# Patient Record
Sex: Female | Born: 1965 | Race: White | Hispanic: No | Marital: Married | Smoking: Former smoker
Health system: Southern US, Community
[De-identification: ages and names within clinical notes are randomized; demographics above are authoritative.]

## PROBLEM LIST (undated history)

## (undated) DIAGNOSIS — N912 Amenorrhea, unspecified: Secondary | ICD-10-CM

## (undated) DIAGNOSIS — E119 Type 2 diabetes mellitus without complications: Secondary | ICD-10-CM

## (undated) DIAGNOSIS — I10 Essential (primary) hypertension: Secondary | ICD-10-CM

## (undated) DIAGNOSIS — F329 Major depressive disorder, single episode, unspecified: Secondary | ICD-10-CM

## (undated) DIAGNOSIS — IMO0002 Reserved for concepts with insufficient information to code with codable children: Secondary | ICD-10-CM

## (undated) DIAGNOSIS — F32A Depression, unspecified: Secondary | ICD-10-CM

## (undated) DIAGNOSIS — R32 Unspecified urinary incontinence: Secondary | ICD-10-CM

## (undated) DIAGNOSIS — F419 Anxiety disorder, unspecified: Secondary | ICD-10-CM

## (undated) HISTORY — PX: TUBAL LIGATION: SHX77

## (undated) HISTORY — DX: Reserved for concepts with insufficient information to code with codable children: IMO0002

## (undated) HISTORY — DX: Type 2 diabetes mellitus without complications: E11.9

## (undated) HISTORY — DX: Anxiety disorder, unspecified: F41.9

## (undated) HISTORY — DX: Essential (primary) hypertension: I10

## (undated) HISTORY — DX: Depression, unspecified: F32.A

## (undated) HISTORY — DX: Amenorrhea, unspecified: N91.2

## (undated) HISTORY — DX: Unspecified urinary incontinence: R32

## (undated) HISTORY — PX: CARPAL TUNNEL RELEASE: SHX101

## (undated) HISTORY — DX: Major depressive disorder, single episode, unspecified: F32.9

---

## 2001-09-04 ENCOUNTER — Other Ambulatory Visit: Admission: RE | Admit: 2001-09-04 | Discharge: 2001-09-04 | Payer: Self-pay | Admitting: Family Medicine

## 2001-09-15 ENCOUNTER — Encounter: Admission: RE | Admit: 2001-09-15 | Discharge: 2001-09-15 | Payer: Self-pay | Admitting: Family Medicine

## 2001-09-15 ENCOUNTER — Encounter: Payer: Self-pay | Admitting: Family Medicine

## 2002-08-31 ENCOUNTER — Other Ambulatory Visit: Admission: RE | Admit: 2002-08-31 | Discharge: 2002-08-31 | Payer: Self-pay | Admitting: Family Medicine

## 2004-01-26 ENCOUNTER — Other Ambulatory Visit: Admission: RE | Admit: 2004-01-26 | Discharge: 2004-01-26 | Payer: Self-pay | Admitting: Family Medicine

## 2004-09-04 ENCOUNTER — Encounter: Admission: RE | Admit: 2004-09-04 | Discharge: 2004-09-04 | Payer: Self-pay | Admitting: Obstetrics & Gynecology

## 2004-11-23 ENCOUNTER — Inpatient Hospital Stay (HOSPITAL_COMMUNITY): Admission: AD | Admit: 2004-11-23 | Discharge: 2004-11-23 | Payer: Self-pay | Admitting: Obstetrics & Gynecology

## 2004-11-25 ENCOUNTER — Inpatient Hospital Stay (HOSPITAL_COMMUNITY): Admission: AD | Admit: 2004-11-25 | Discharge: 2004-11-29 | Payer: Self-pay | Admitting: Obstetrics and Gynecology

## 2004-11-26 ENCOUNTER — Encounter (INDEPENDENT_AMBULATORY_CARE_PROVIDER_SITE_OTHER): Payer: Self-pay | Admitting: *Deleted

## 2005-01-15 ENCOUNTER — Ambulatory Visit (HOSPITAL_COMMUNITY): Admission: EM | Admit: 2005-01-15 | Discharge: 2005-01-15 | Payer: Self-pay | Admitting: Emergency Medicine

## 2005-01-15 ENCOUNTER — Encounter (INDEPENDENT_AMBULATORY_CARE_PROVIDER_SITE_OTHER): Payer: Self-pay | Admitting: *Deleted

## 2005-08-26 ENCOUNTER — Encounter: Admission: RE | Admit: 2005-08-26 | Discharge: 2005-08-26 | Payer: Self-pay | Admitting: Family Medicine

## 2006-05-02 ENCOUNTER — Ambulatory Visit: Payer: Self-pay | Admitting: Family Medicine

## 2007-01-26 ENCOUNTER — Encounter: Admission: RE | Admit: 2007-01-26 | Discharge: 2007-01-26 | Payer: Self-pay | Admitting: Family Medicine

## 2008-02-10 ENCOUNTER — Encounter: Admission: RE | Admit: 2008-02-10 | Discharge: 2008-02-10 | Payer: Self-pay | Admitting: Family Medicine

## 2008-05-17 ENCOUNTER — Encounter: Admission: RE | Admit: 2008-05-17 | Discharge: 2008-08-01 | Payer: Self-pay | Admitting: Family Medicine

## 2008-05-27 ENCOUNTER — Other Ambulatory Visit: Admission: RE | Admit: 2008-05-27 | Discharge: 2008-05-27 | Payer: Self-pay | Admitting: Family Medicine

## 2009-03-06 ENCOUNTER — Encounter: Admission: RE | Admit: 2009-03-06 | Discharge: 2009-03-06 | Payer: Self-pay | Admitting: Family Medicine

## 2010-06-02 ENCOUNTER — Encounter: Payer: Self-pay | Admitting: Family Medicine

## 2010-06-03 ENCOUNTER — Encounter: Payer: Self-pay | Admitting: Family Medicine

## 2010-09-28 NOTE — Op Note (Signed)
NAME:  Gabrielle Russell, Gabrielle Russell                ACCOUNT NO.:  [id number]   MEDICAL RECORD NO.:  [id number]          PATIENT TYPE:  EMS   LOCATION:  ED                           FACILITY:  [facility]   PHYSICIAN:  Lorre Munroe., M.D.DATE OF BIRTH:  Dec 08, 1965   DATE OF PROCEDURE:  01/15/2005  DATE OF DISCHARGE:                                 OPERATIVE REPORT   PREOPERATIVE DIAGNOSIS:  Right breast abscess.   POSTOPERATIVE DIAGNOSIS:  Right breast abscess.   OPERATION:  Incision and drainage of right breast abscess.   SURGEON:  Lebron Conners, M.D.   ANESTHESIA:  General.   PROCEDURE:  After the patient was monitored and anesthetized and had routine  preparation and draping of the right breast, I made a lateral circumareolar  incision over the induration and redness.  I quickly entered a very large  abscess cavity occupying large part of the lateral aspect of the breast.  I  suctioned it out and took cultures.  I widely opened the incision and then  probed and used a hemostat make sure I had broken up all the ramifications  of the abscess.  Palpating in other areas some milk could be squeezed out of  portions of the breast, but I did not feel there was any other abscess.  I  then removed a portion of abscess wall and sent that as a biopsy.  The  hemostasis was easily achieved with the Bovie.  I packed the cavity with  moist gauze and applied a bulky compressive bandage.  The patient tolerated  the operation well.      Lorre Munroe., M.D.  Electronically Signed     WB/MEDQ  D:  01/15/2005  T:  01/16/2005  Job:  [id number]   cc:   Genia Del, M.D.  [address]  [place]  [place] [address]  Fax: [phone]

## 2010-09-28 NOTE — Op Note (Signed)
NAME:  Gabrielle Russell, Gabrielle Russell                ACCOUNT NO.:  [id number]   MEDICAL RECORD NO.:  [id number]          PATIENT TYPE:  INP   LOCATION:  9101                          [facility]ACILITY:  [facility]   PHYSICIAN:  Genia Del, M.D.DATE OF BIRTH:  24-Jul-1965   DATE OF PROCEDURE:  11/26/2004  DATE OF DISCHARGE:                                 OPERATIVE REPORT   PREOPERATIVE DIAGNOSIS:  39+ weeks gestation, gestational diabetes mellitus  A2, oligohydramnios, mild pregnancy-induced hypertension, failure to  progress, desire for voluntary tubal sterilization.   POSTOPERATIVE DIAGNOSIS:  39+ weeks gestation, gestational diabetes mellitus  A2, oligohydramnios, mild pregnancy-induced hypertension, failure to  progress, desire for voluntary tubal sterilization.   INTERVENTION:  Urgent primary low transverse C-section, plus bilateral tubal  sterilization by modified Pomeroy procedure.   SURGEON:  Genia Del, M.D.   ANESTHESIOLOGIST:  Germaine Pomfret, M.D.   PROCEDURE:  Under epidural anesthesia, the patient was in 15-degree left  decubitus position.  She was prepped with Hibiclens on the abdominal  suprapubic, and vulvar areas.  The bladder catheter was already in place,  and the patient was draped as usual.  Verification of anesthesia, which was  adequate.  Infiltration of Marcaine 0.25% plain 20 cc subcutaneously.  A  Pfannenstiel incision with a scalpel.  The aponeurosis was opened  transversely with Mayo scissors.  The recti muscles were separated from the  aponeurosis superiorly and inferiorly.  The parietal peritoneum was opened  longitudinally with Metzenbaum scissors.  We then put the bladder in place.  The visceral peritoneum was opened transversely with Metzenbaum scissors.  The bladder was reclined downward and the bladder retractor was  repositioned.  We made a low transverse hysterotomy with a scalpel and  extended on each side with dressing scissors.  The amniotic fluid was  clear.  The fetus was in cephalic presentation.  Birth of a baby boy at 3:00 a.m.  The baby was suctioned after delivery of the head.  A loose cord was  present.  We clamped the cord and cut.  The baby was given to the neonatal  team.  Apgars were 9 and 9.  The placenta was extracted manually.  Revision  of the intrauterine cavity.  Pitocin IV was started.  A dose of Ancef 1 g IV  was given.  The cord blood had been taken.  We then closed the hysterotomy  with a first running locked suture of Vicryl 0.  A second plain was done in  a mattress fashion was done with Vicryl 0.  Hemostasis was adequate at all  levels at the hysterotomy.  Both ovaries were normal to inspection.  Both  tubes were normal to inspection.  We proceeded with a modified Pomeroy  procedure for bilateral tubal sterilization, starting on the right side.  We  used plain to suture the tube proximally at about 2 cm from the cornu and  then distally.  We cut a segment of tube in between the two sutures and  coagulated each extremity of the cut tube.  The segment was sent to  pathology.  We proceeded  exactly the same way on the left side.  Hemostasis  was adequate at those levels.  The uterus was well contracted.  We verified  hemostasis on the hysterotomy site and bladder flap as well as the recti  muscles.  It was adequate at all levels.  We irrigated and suctioned the  abdominopelvic cavity.  We then closed the aponeurosis in two half running  sutures of Vicryl 0.  Hemostasis was completed at the adipose tissue with the electrocautery.  We  then reapproximated the skin was staples.  The estimated blood loss was 600  cc.  The count of sponges and instruments was complete x 2.  A dry dressing  was applied on the wound.  The patient was transferred to the recovery room  in good status.       ML/MEDQ  D:  11/26/2004  T:  11/26/2004  Job:  [id number]

## 2010-09-28 NOTE — H&P (Signed)
NAME:  Gabrielle Russell, Gabrielle Russell                ACCOUNT NO.:  [id number]   MEDICAL RECORD NO.:  [id number]          PATIENT TYPE:  INP   LOCATION:  9164                          [facility]ACILITY:  [facility]   PHYSICIAN:  Genia Del, M.D.DATE OF BIRTH:  May 28, 1965   DATE OF ADMISSION:  11/25/2004  DATE OF DISCHARGE:                                HISTORY & PHYSICAL   Gabrielle Russell is a 45 year old G2, P0, A1, expected date of delivery November 30, 2004, at 39 weeks and 2 days gestation.   REASON FOR ADMISSION:  Mild PIH with oligohydramnios, for induction.   HISTORY OF PRESENT ILLNESS:  Fetal movements positive.  No regular uterine  contractions.  No vaginal bleeding, no fluid leak.  No PIH symptoms.  The  patient was evaluated at [facility] and maternity admission on November 23, 2004 for increased blood pressure, and ultrasound was done showing  oligohydramnios, but biophysical profile was 10/10, with an NST showing good  accelerations, no decelerations.  PIH labs were within normal limits.  So,  the decision was to observe until induction today.   PAST MEDICAL HISTORY:  Negative.   PAST SURGICAL HISTORY:  Negative, except for a therapeutic abortion in 1990.  No complications.   ALLERGIES:  No known drug allergies.   MEDICATIONS:  1.  Prenatal vitamins.  2.  Insulin NPH and Regular.   SOCIAL HISTORY:  Married.  Nonsmoker.   FAMILY HISTORY:  Mother with diabetes mellitus.   HISTORY OF PRESENT PREGNANCY:  First trimester was normal.  Hemoglobin 11.7,  platelets 273.  A positive.  Antibodies negative.  STD workup negative.  Rubella immune.  Ultra screen was within normal limits.  The patient  declined amniocentesis.  AFP at 16+ weeks was within normal limits.  Ultrasound review of anatomy was within normal limits at 20+ weeks, except  the spine was suboptimal, and then within normal limits on a repeat  ultrasound.  One-hour GTT was abnormal.  Three-hour GTT was abnormal.  The  patient was  diagnosed with gestational diabetes mellitus.  Insulin was  started at 33+ weeks.  An ultrasound for interval growth at 31+ weeks had  shown a 97th percentile for estimated fetal weight.  Amniotic fluid was  within normal limits.  Blood pressures remained at diastolic 90 or less  until the last visit at 39 weeks, when the blood pressure was 146/100.  The  estimated fetal weight at 39 weeks was below 9 pounds.   REVIEW OF SYSTEMS:  CONSTITUTIONAL:  Negative.  HEENT:  Negative.  CARDIOVASCULAR/RESPIRATORY:  Negative.  GI/URO:  Negative.  DERMATOLOGY/NEUROLOGIC/ENDOCRINE:  Negative.   PHYSICAL EXAMINATION:  GENERAL:  No apparent distress.  VITAL SIGNS:  Blood pressure 140s/90s; pulse regular; respiratory rate  normal; afebrile.  LUNGS:  Clear bilateral.  CARDIAC:  Regular cardiac rhythm.  PELVIC:  Gravid uterus.  Cephalic presentation.  Vaginal exam on admission  was 3+/80%/vertex 0.  Membranes were intact.  LOWER LIMBS:  Mild edema.  DTRs 1+.  No clonus.   LABORATORIES:  PIH labs were within normal limits.  Uric acid 4.6.  Platelets 193.  AST and ALT 19 and 14, respectively.  Fetal heart rate  monitoring 140s, with good accelerations, no decelerations.  No regular  uterine contractions.  Group B Streptococcus at 35+ weeks was negative.   IMPRESSION:  G2, P0 at 39+ weeks, with gestational diabetes mellitus A2,  oligohydramnios, and mild PIH.  Fetal well-being reassuring.  Group B  Streptococcus negative.   PLAN:  Admit to labor and delivery.  Induce labor with low-dose Pitocin and  artificial rupture of membranes.  Monitoring.  Expectant management toward  probable vaginal delivery.  The risk of shoulder dystocia for fetuses with  macrosomia and diabetes mellitus was discussed with the patient, and the  patient understands that risk and is willing to attempt a vaginal delivery.       ML/MEDQ  D:  11/25/2004  T:  11/25/2004  Job:  [id number]

## 2010-09-28 NOTE — Discharge Summary (Signed)
NAME:  Gabrielle Russell, Gabrielle Russell                ACCOUNT NO.:  [id number]   MEDICAL RECORD NO.:  [id number]          PATIENT TYPE:  INP   LOCATION:  9101                          [facility]ACILITY:  [facility]   PHYSICIAN:  Genia Del, M.D.DATE OF BIRTH:  November 14, 1965   DATE OF ADMISSION:  11/25/2004  DATE OF DISCHARGE:  11/29/2004                                 DISCHARGE SUMMARY   ADMISSION DIAGNOSES:  1.  38+ weeks.  2.  Gestational diabetes A2.  3.  Oligohydramnios.  4.  Mild pregnancy induced hypertension.  5.  Desire for bilateral tubal sterilization.   DISCHARGE DIAGNOSES:  1.  38+ weeks.  2.  Gestational diabetes A2.  3.  Oligohydramnios.  4.  Mild pregnancy induced hypertension.  5.  Desire for bilateral tubal sterilization.  6.  Failure to progress in dilatation.   INTERVENTION:  Urgent primary low transverse cesarean section with bilateral  tubal sterilization with Pomeroy modified technique.  Birth of a baby boy at  3:00.  Apgars were 9 and 9.   HOSPITAL COURSE:  Patient was admitted for induction because of mild PIH and  oligohydramnios.  She progressed in labor up to 9 cm, but then had no  progression for three hours at that dilatation.  A decision was therefore  taken to proceed with urgent primary cesarean section and a bilateral tubal  sterilization.  She had a baby boy.  Apgars were 9 and 9.  The hysterotomy  was closed in two layers.  The estimated blood loss was 600 mL.  No  complications occurred and the postoperative course was unremarkable.  She  remained afebrile and hemodynamically stable.  Her hemoglobin postoperative  was 9.2 and then on the next day went to 8.7.  Hematocrit was 26.2.  She did  have an increase in her blood pressures in the postoperative period which  were controlled with labetalol.  Her PIH laboratories remained within normal  limits.  She was discharged on postoperative day #3 in good status.  Postoperative advice were given.  She was given also advices  concerning diet  given her gestational diabetes requiring insulin and was given advice  concerning associated symptoms with PIH.  She will follow up at [facility] in four weeks.  Tylox p.r.n. for pain and Chromagen Forte for anemia  were prescribed.      Genia Del, M.D.  Electronically Signed     ML/MEDQ  D:  01/31/2005  T:  01/31/2005  Job:  [id number]

## 2010-09-28 NOTE — H&P (Signed)
NAME:  Gabrielle Russell, SIGMAN                ACCOUNT NO.:  [id number]   MEDICAL RECORD NO.:  [id number]          PATIENT TYPE:  EMS   LOCATION:  ED                           [facility]ACILITY:  [facility]   PHYSICIAN:  Lorre Munroe., M.D.DATE OF BIRTH:  [birth year]   DATE OF ADMISSION:  01/15/2005  DATE OF DISCHARGE:                                HISTORY & PHYSICAL   CHIEF COMPLAINT:  Right breast pain.   PRESENT ILLNESS:  This is a 45 year old white female who is 7 weeks  postpartum, who is lactating, and has had increasing redness, tenderness,  and pain in the lateral aspect of the right breast.  It became quite severe  today.  She has been receiving Keflex but that has not improved but rather  has gotten worse.  I saw her in the emergency department and felt she had a  breast abscess and advised incision and drainage in the operating room and  she agrees to have this done.  There is no history of trauma.   PAST MEDICAL HISTORY:  The the patient's health is excellent.  She had best  gestational diabetes but blood sugars have returned to normal, and she is  not on any medicine for diabetes, and her blood sugar in the emergency  department was 100. She denies high blood pressure and other serious medical  problems.   She had a cesarean section and has had no other operations.   The family history and childhood illnesses are unremarkable.   There are no medicine allergies.   She does not smoke or drink.   REVIEW OF SYSTEMS:  Fifteen points are totally unremarkable.   PHYSICAL EXAMINATION:  MENTAL STATUS:  Normal.  GENERAL:  She complains of some moderate pain.  VITAL SIGNS:  Unremarkable and temperature normal as recorded by nursing  staff.  HEENT:  Unremarkable.  CHEST:  Clear to auscultation.  HEART:  Rate and rhythm normal.  No murmur or gallop.  BREASTS:  The right breast is extremely tender, indurated, red on the  lateral aspect.  The left breast is normal with some lumpiness due to  the  fact that she is lactating and needed to pump her breast.  ABDOMEN:  No mass, tenderness, or organomegaly. A well-healed lower  abdominal incision.  EXTREMITIES:  Normal.  No edema.  Good pulses.  No skin lesions.  SKIN:  No lesions are noted.  LYMPH NODES:  None enlarged.   IMPRESSION:  1.  Right breast abscess.   PLAN:  A median I&D of the right breast abscess in the operating room under  general anesthesia.  The patient consents to this.  She realizes she will  have to discontinue lactation for a couple of days and then can resume.      Lorre Munroe., M.D.  Electronically Signed     WB/MEDQ  D:  01/15/2005  T:  01/15/2005  Job:  [id number]   cc:   Genia Del, M.D.  [address]  [place]  [place] [address]  Fax: [phone]

## 2011-11-20 ENCOUNTER — Other Ambulatory Visit: Payer: Self-pay | Admitting: Family Medicine

## 2011-11-20 DIAGNOSIS — Z1231 Encounter for screening mammogram for malignant neoplasm of breast: Secondary | ICD-10-CM

## 2011-11-28 ENCOUNTER — Ambulatory Visit: Payer: Self-pay

## 2011-12-17 ENCOUNTER — Ambulatory Visit
Admission: RE | Admit: 2011-12-17 | Discharge: 2011-12-17 | Disposition: A | Discharge status: Home / Self Care | Payer: BC Managed Care – PPO | Source: Ambulatory Visit | Attending: Family Medicine | Admitting: Family Medicine

## 2011-12-17 DIAGNOSIS — Z1231 Encounter for screening mammogram for malignant neoplasm of breast: Secondary | ICD-10-CM

## 2013-04-13 ENCOUNTER — Other Ambulatory Visit: Payer: Self-pay | Admitting: Family Medicine

## 2013-04-13 ENCOUNTER — Other Ambulatory Visit (HOSPITAL_COMMUNITY)
Admission: RE | Admit: 2013-04-13 | Discharge: 2013-04-13 | Disposition: A | Discharge status: Home / Self Care | Payer: BC Managed Care – PPO | Source: Ambulatory Visit | Attending: Family Medicine | Admitting: Family Medicine

## 2013-04-13 DIAGNOSIS — Z0000 Encounter for general adult medical examination without abnormal findings: Secondary | ICD-10-CM | POA: Insufficient documentation

## 2013-04-13 DIAGNOSIS — Z1151 Encounter for screening for human papillomavirus (HPV): Secondary | ICD-10-CM | POA: Insufficient documentation

## 2013-06-24 ENCOUNTER — Other Ambulatory Visit: Payer: Self-pay

## 2013-06-24 DIAGNOSIS — Z1231 Encounter for screening mammogram for malignant neoplasm of breast: Secondary | ICD-10-CM

## 2013-07-13 ENCOUNTER — Ambulatory Visit: Payer: BC Managed Care – PPO

## 2013-12-15 ENCOUNTER — Encounter: Payer: Self-pay | Admitting: Certified Nurse Midwife

## 2013-12-15 ENCOUNTER — Ambulatory Visit (INDEPENDENT_AMBULATORY_CARE_PROVIDER_SITE_OTHER): Payer: BC Managed Care – PPO | Admitting: Certified Nurse Midwife

## 2013-12-15 VITALS — BP 134/70 | HR 64 | Resp 16 | Ht 63.75 in | Wt 211.0 lb

## 2013-12-15 DIAGNOSIS — N912 Amenorrhea, unspecified: Secondary | ICD-10-CM

## 2013-12-15 DIAGNOSIS — E119 Type 2 diabetes mellitus without complications: Secondary | ICD-10-CM

## 2013-12-15 DIAGNOSIS — N390 Urinary tract infection, site not specified: Secondary | ICD-10-CM

## 2013-12-15 DIAGNOSIS — N9489 Other specified conditions associated with female genital organs and menstrual cycle: Secondary | ICD-10-CM

## 2013-12-15 DIAGNOSIS — B373 Candidiasis of vulva and vagina: Secondary | ICD-10-CM

## 2013-12-15 DIAGNOSIS — N898 Other specified noninflammatory disorders of vagina: Secondary | ICD-10-CM

## 2013-12-15 DIAGNOSIS — E7800 Pure hypercholesterolemia, unspecified: Secondary | ICD-10-CM | POA: Insufficient documentation

## 2013-12-15 DIAGNOSIS — B3731 Acute candidiasis of vulva and vagina: Secondary | ICD-10-CM

## 2013-12-15 DIAGNOSIS — I10 Essential (primary) hypertension: Secondary | ICD-10-CM | POA: Insufficient documentation

## 2013-12-15 DIAGNOSIS — Z0000 Encounter for general adult medical examination without abnormal findings: Secondary | ICD-10-CM

## 2013-12-15 DIAGNOSIS — N951 Menopausal and female climacteric states: Secondary | ICD-10-CM

## 2013-12-15 LAB — POCT URINALYSIS DIPSTICK
Bilirubin, UA: NEGATIVE
Blood, UA: NEGATIVE
Glucose, UA: 250
KETONES UA: NEGATIVE
Nitrite, UA: NEGATIVE
PH UA: 5
PROTEIN UA: NEGATIVE
Urobilinogen, UA: NEGATIVE

## 2013-12-15 LAB — HEMOGLOBIN, FINGERSTICK: Hemoglobin, fingerstick: 12.6 g/dL (ref 12.0–16.0)

## 2013-12-15 MED ORDER — TERCONAZOLE 0.4 % VA CREA: 1.0000 | TOPICAL_CREAM | Freq: Every day | VAGINAL | Status: DC

## 2013-12-15 NOTE — Patient Instructions (Addendum)
Menopause Menopause is the normal time of life when menstrual periods stop completely. Menopause is complete when you have missed 12 consecutive menstrual periods. It usually occurs between the ages of 48 years and 55 years. Very rarely does a woman develop menopause before the age of 40 years. At menopause, your ovaries stop producing the female hormones estrogen and progesterone. This can cause undesirable symptoms and also affect your health. Sometimes the symptoms may occur 4-5 years before the menopause begins. There is no relationship between menopause and:  Oral contraceptives.  Number of children you had.  Race.  The age your menstrual periods started (menarche). Heavy smokers and very thin women may develop menopause earlier in life. CAUSES  The ovaries stop producing the female hormones estrogen and progesterone.  Other causes include:  Surgery to remove both ovaries.  The ovaries stop functioning for no known reason.  Tumors of the pituitary gland in the brain.  Medical disease that affects the ovaries and hormone production.  Radiation treatment to the abdomen or pelvis.  Chemotherapy that affects the ovaries. SYMPTOMS   Hot flashes.  Night sweats.  Decrease in sex drive.  Vaginal dryness and thinning of the vagina causing painful intercourse.  Dryness of the skin and developing wrinkles.  Headaches.  Tiredness.  Irritability.  Memory problems.  Weight gain.  Bladder infections.  Hair growth of the face and chest.  Infertility. More serious symptoms include:  Loss of bone (osteoporosis) causing breaks (fractures).  Depression.  Hardening and narrowing of the arteries (atherosclerosis) causing heart attacks and strokes. DIAGNOSIS   When the menstrual periods have stopped for 12 straight months.  Physical exam.  Hormone studies of the blood. TREATMENT  There are many treatment choices and nearly as many questions about them. The  decisions to treat or not to treat menopausal changes is an individual choice made with your health care provider. Your health care provider can discuss the treatments with you. Together, you can decide which treatment will work best for you. Your treatment choices may include:   Hormone therapy (estrogen and progesterone).  Non-hormonal medicines.  Treating the individual symptoms with medicine (for example antidepressants for depression).  Herbal medicines that may help specific symptoms.  Counseling by a psychiatrist or psychologist.  Group therapy.  Lifestyle changes including:  Eating healthy.  Regular exercise.  Limiting caffeine and alcohol.  Stress management and meditation.  No treatment. HOME CARE INSTRUCTIONS   Take the medicine your health care provider gives you as directed.  Get plenty of sleep and rest.  Exercise regularly.  Eat a diet that contains calcium (good for the bones) and soy products (acts like estrogen hormone).  Avoid alcoholic beverages.  Do not smoke.  If you have hot flashes, dress in layers.  Take supplements, calcium, and vitamin D to strengthen bones.  You can use over-the-counter lubricants or moisturizers for vaginal dryness.  Group therapy is sometimes very helpful.  Acupuncture may be helpful in some cases. SEEK MEDICAL CARE IF:   You are not sure you are in menopause.  You are having menopausal symptoms and need advice and treatment.  You are still having menstrual periods after age 55 years.  You have pain with intercourse.  Menopause is complete (no menstrual period for 12 months) and you develop vaginal bleeding.  You need a referral to a specialist (gynecologist, psychiatrist, or psychologist) for treatment. SEEK IMMEDIATE MEDICAL CARE IF:   You have severe depression.  You have excessive vaginal bleeding.    You fell and think you have a broken bone.  You have pain when you urinate.  You develop leg or  chest pain.  You have a fast pounding heart beat (palpitations).  You have severe headaches.  You develop vision problems.  You feel a lump in your breast.  You have abdominal pain or severe indigestion. Document Released: 07/20/2003 Document Revised: 12/30/2012 Document Reviewed: 11/26/2012 [facility] Patient Information 2015 [place], [place]. This information is not intended to replace advice given to you by your health care provider. Make sure you discuss any questions you have with your health care provider. Monilial Vaginitis Vaginitis in a soreness, swelling and redness (inflammation) of the vagina and vulva. Monilial vaginitis is not a sexually transmitted infection. CAUSES  Yeast vaginitis is caused by yeast (candida) that is normally found in your vagina. With a yeast infection, the candida has overgrown in number to a point that upsets the chemical balance. SYMPTOMS  White, thick vaginal discharge. Swelling, itching, redness and irritation of the vagina and possibly the lips of the vagina (vulva). Burning or painful urination. Painful intercourse. DIAGNOSIS  Things that may contribute to monilial vaginitis are: Postmenopausal and virginal states. Pregnancy. Infections. Being tired, sick or stressed, especially if you had monilial vaginitis in the past. Diabetes. Good control will help lower the chance. Birth control pills. Tight fitting garments. Using bubble bath, feminine sprays, douches or deodorant tampons. Taking certain medications that kill germs (antibiotics). Sporadic recurrence can occur if you become ill. TREATMENT  Your caregiver will give you medication. There are several kinds of anti monilial vaginal creams and suppositories specific for monilial vaginitis. For recurrent yeast infections, use a suppository or cream in the vagina 2 times a week, or as directed. Anti-monilial or steroid cream for the itching or irritation of the vulva may also be used. Get  your caregiver's permission. Painting the vagina with methylene blue solution may help if the monilial cream does not work. Eating yogurt may help prevent monilial vaginitis. HOME CARE INSTRUCTIONS  Finish all medication as prescribed. Do not have sex until treatment is completed or after your caregiver tells you it is okay. Take warm sitz baths. Do not douche. Do not use tampons, especially scented ones. Wear cotton underwear. Avoid tight pants and panty hose. Tell your sexual partner that you have a yeast infection. They should go to their caregiver if they have symptoms such as mild rash or itching. Your sexual partner should be treated as well if your infection is difficult to eliminate. Practice safer sex. Use condoms. Some vaginal medications cause latex condoms to fail. Vaginal medications that harm condoms are: Cleocin cream. Butoconazole (Femstat). Terconazole (Terazol) vaginal suppository. Miconazole (Monistat) (may be purchased over the counter). SEEK MEDICAL CARE IF:  You have a temperature by mouth above 102 F (38.9 C). The infection is getting worse after 2 days of treatment. The infection is not getting better after 3 days of treatment. You develop blisters in or around your vagina. You develop vaginal bleeding, and it is not your menstrual period. You have pain when you urinate. You develop intestinal problems. You have pain with sexual intercourse. Document Released: 02/06/2005 Document Revised: 07/22/2011 Document Reviewed: 10/21/2008 [facility] Patient Information 2015 [place], [place]. This information is not intended to replace advice given to you by your health care provider. Make sure you discuss any questions you have with your health care provider.

## 2013-12-15 NOTE — Progress Notes (Signed)
48 y.o. G58P1011 Married Caucasian Fe here to establish gyn care and for problem. [facility] with pap not due until 12/15. Patient is being seen by PCP  Dr. Addison Lank for Type 2 diabetes, hypertension and cholesterol management. "I am metabolic mess, but improving". Patient here to discuss if she is menopausal or not. No period since 05/13/2010, but history of irregular cycles and amenorrhea with history of PCOS. Patient has started having hot flashes and night sweats unrelated to her diabetes. She checks her glucose daily and is stable at present. Aware that her medications can cause night sweats, but this is new. Some vaginal dryness. Last pap smear with Dr. Addison Lank 12/14 at [facility]. Will plan aex here for next pap. Help! No other health issues today. No UTI symptoms.  Patient's last menstrual period was 05/13/2010.          Sexually active: Yes.    The current method of family planning is tubal ligation.    Exercising: Yes.    weight training & cardio Smoker:  no  Health Maintenance: Pap:  12/14 MMG:  2013 normal per patient   Colonoscopy:  none BMD:   none TDaP:  2008 Labs: Poct urine-glucose 250, wbc tr Self breast exam: done occ   reports that she has quit smoking. She does not have any smokeless tobacco history on file. She reports that she drinks about .6 - 1.2 ounces of alcohol per week. She reports that she does not use illicit drugs.  Past Medical History  Diagnosis Date  . Amenorrhea   . Anxiety   . Depression   . Diabetes mellitus without complication   . Dyspareunia   . Hypertension   . Urinary incontinence     Past Surgical History  Procedure Laterality Date  . Carpal tunnel release Right   . Tubal ligation    . Cesarean section      Current Outpatient Prescriptions  Medication Sig Dispense Refill  . amLODipine (NORVASC) 2.5 MG tablet Take 2.5 mg by mouth daily.      Marland Kitchen CALCIUM PO Take by mouth daily.      . Cholecalciferol (VITAMIN D PO) Take by mouth daily.      . clonazePAM  (KLONOPIN) 1 MG tablet as needed.      . CRESTOR 10 MG tablet daily.      Marland Kitchen escitalopram (LEXAPRO) 10 MG tablet at bedtime.      Marland Kitchen MAGNESIUM PO Take by mouth daily.      . metFORMIN (GLUCOPHAGE-XR) 750 MG 24 hr tablet Take 750 mg by mouth. Takes 2      . Multiple Vitamins-Minerals (MULTIVITAMIN PO) Take by mouth daily.      . Omega-3 Fatty Acids (FISH OIL PO) Take by mouth daily.      . ONE TOUCH ULTRA TEST test strip       . telmisartan-hydrochlorothiazide (MICARDIS HCT) 80-25 MG per tablet daily.       No current facility-administered medications for this visit.    Family History  Problem Relation Age of Onset  . Diabetes Mother   . Hypertension Mother   . Stroke Mother   . Heart attack Mother   . Hypertension Brother   . Heart attack Brother     ROS:  Pertinent items are noted in HPI.  Otherwise, a comprehensive ROS was negative.  Exam:   BP 134/70  Pulse 64  Resp 16  Ht 5' 3.75" (1.619 m)  Wt 211 lb (95.709 kg)  BMI 36.51  kg/m2  LMP 05/13/2010 Height: 5' 3.75" (161.9 cm)  Ht Readings from Last 3 Encounters:  12/15/13 5' 3.75" (1.619 m)    General appearance: alert, cooperative and appears stated age Head: Normocephalic, without obvious abnormality, atraumatic Neck: no adenopathy, supple, symmetrical, trachea midline and thyroid normal to inspection and palpation and non-palpable No abnormal inguinal nodes palpated Neurologic: Grossly normal   Pelvic: External genitalia:  no lesions              Urethra:  normal appearing urethra with no masses, tenderness or lesions, Bladder non tender or urethral meatus non tender              Bartholin's and Skene's: normal                 Vagina: normal appearing vagina with normal color and discharge, no lesions              Cervix: normal appearance, no lesions              Pap taken: No. Bimanual Exam:  Uterus:  normal size, contour, position, consistency, mobility, non-tender and mid positon              Adnexa: normal  adnexa and no mass, fullness, tenderness               Rectovaginal: Confirms                A:  Normal pelvic exam  History of prolonged amenorrhea and irregular cycles and PCOS  Perimenopause/menopause symptomatic?  Hypertension/Elevated cholesterol/Type 2 Diabetes with PCP management  R/O UTI aysmptomatic  Yeast vaginitis  P:   Discussed findings of normal pelvic exam and can be perimenopausal or menopausal or not with history of amenorrhea. Weight and history metabolic issues can also contribute. Recommend FSH,TSH,Prolactin to evaluate. Patient agreeable. Will await lab results and then discuss options of management. Patient happy someone is finally"listening to her".  Patient to advise if vaginal bleeding occurs before next visit. Lab:FSH,TSH,Prolactin, Urine micro/culture Reviewed UTI warning signs and to advise if occurs Reviewed findings of yeast and etiology. Rx terazol 7 see order   An After Visit Summary was printed and given to the patient.

## 2013-12-16 LAB — URINALYSIS, MICROSCOPIC ONLY
BACTERIA UA: NONE SEEN
CASTS: NONE SEEN
Crystals: NONE SEEN
Squamous Epithelial / LPF: NONE SEEN

## 2013-12-16 LAB — URINE CULTURE
COLONY COUNT: NO GROWTH
Organism ID, Bacteria: NO GROWTH

## 2013-12-16 LAB — PROLACTIN: Prolactin: 8.2 ng/mL

## 2013-12-16 LAB — TSH: TSH: 2.816 u[IU]/mL (ref 0.350–4.500)

## 2013-12-16 LAB — FOLLICLE STIMULATING HORMONE: FSH: 44.1 m[IU]/mL

## 2013-12-16 NOTE — Progress Notes (Signed)
Reviewed personally.  M. Suzanne Mitsugi Schrader, MD.  

## 2013-12-24 ENCOUNTER — Telehealth: Payer: Self-pay | Admitting: Certified Nurse Midwife

## 2013-12-24 NOTE — Telephone Encounter (Signed)
LMTCB regarding results.  

## 2013-12-27 ENCOUNTER — Telehealth: Payer: Self-pay

## 2013-12-27 DIAGNOSIS — N951 Menopausal and female climacteric states: Secondary | ICD-10-CM

## 2013-12-27 DIAGNOSIS — N912 Amenorrhea, unspecified: Secondary | ICD-10-CM

## 2013-12-27 NOTE — Telephone Encounter (Signed)
Message copied by Jasmine Awe on Mon Dec 27, 2013  8:34 AM ------      Message from: Regina Eck      Created: Mon Dec 27, 2013  8:02 AM       Please notify patient that urine micro and culture are negative      FSH shows low menopausal range and feel patient should be further evaluated with PUS, due to prolonged time without period and her history of PCOS which will let us assess endometrial thickness which could be a concern and assess ovaries. This will also give better information in management of care regarding menopause. Dr. Sabra Heck concurs with this recommendation. Order in      Prolactin and TSH are normal.      I have personally tried to call patient and was only able to leave a message only once. ------

## 2013-12-27 NOTE — Telephone Encounter (Signed)
Spoke with patient. Advised of results as seen below. Patient agreeable. Patient would like to schedule PUS at this time. Patient requesting Thursday morning appointment.Appointment scheduled for 9/3 at 9:30am with 10am consult with Dr.Silva.Patient agreeable to date and time. Patient verbalized understanding of the [facility] appointment cancellation policy. Advised will need to cancel within 72 business hours (3 business days) or will have $100.00 no show fee placed to account.   Cc: Dr.Silva  Routing to provider for final review. Patient agreeable to disposition. Will close encounter

## 2013-12-27 NOTE — Addendum Note (Signed)
Addended by: Regina Eck on: 12/27/2013 08:04 AM   Modules accepted: Orders

## 2013-12-31 ENCOUNTER — Ambulatory Visit: Payer: BC Managed Care – PPO

## 2014-01-05 ENCOUNTER — Ambulatory Visit
Admission: RE | Admit: 2014-01-05 | Discharge: 2014-01-05 | Disposition: A | Discharge status: Home / Self Care | Payer: BC Managed Care – PPO | Source: Ambulatory Visit

## 2014-01-05 DIAGNOSIS — Z1231 Encounter for screening mammogram for malignant neoplasm of breast: Secondary | ICD-10-CM

## 2014-01-13 ENCOUNTER — Ambulatory Visit (INDEPENDENT_AMBULATORY_CARE_PROVIDER_SITE_OTHER): Payer: BC Managed Care – PPO | Admitting: Obstetrics and Gynecology

## 2014-01-13 ENCOUNTER — Encounter: Payer: Self-pay | Admitting: Obstetrics and Gynecology

## 2014-01-13 ENCOUNTER — Ambulatory Visit (INDEPENDENT_AMBULATORY_CARE_PROVIDER_SITE_OTHER): Payer: BC Managed Care – PPO

## 2014-01-13 VITALS — BP 120/80 | HR 60 | Ht 63.75 in | Wt 211.0 lb

## 2014-01-13 DIAGNOSIS — N951 Menopausal and female climacteric states: Secondary | ICD-10-CM

## 2014-01-13 DIAGNOSIS — D259 Leiomyoma of uterus, unspecified: Secondary | ICD-10-CM

## 2014-01-13 DIAGNOSIS — N912 Amenorrhea, unspecified: Secondary | ICD-10-CM

## 2014-01-13 DIAGNOSIS — Z780 Asymptomatic menopausal state: Secondary | ICD-10-CM

## 2014-01-13 NOTE — Progress Notes (Addendum)
GYNECOLOGY  VISIT   HPI: 48 y.o.   Married  Caucasian  female   G2P1011 with Patient's last menstrual period was 05/13/2010.   here for pelvic ultrasound for amenorrhea and 2 episodes of spotting since then. Had spotting in the last 4 months. Wore panty liner.  Had cramping just like a menses and then had spotting.  The other episode was a couple of year ago.  At most has bleeding once a year for the last 4 years.  Prior to 2012 had menses once a month. Some hot flashes.  PCP encouraged her not to pursue HRT due to her medical history.  Mood swings controlled on Lexapro.    [place] on 8/5 44.  Had normal TSH and prolactin also.   GYNECOLOGIC HISTORY: Patient's last menstrual period was 05/13/2010. Contraception:    Menopausal hormone therapy:         OB History   Grav Para Term Preterm Abortions TAB SAB Ect Mult Living   2 1 1  1 1    1          Patient Active Problem List   Diagnosis Date Noted  . Type II or unspecified type diabetes mellitus without mention of complication, not stated as uncontrolled 12/15/2013    Class: History of  . Accelerated hypertension 12/15/2013  . Elevated cholesterol 12/15/2013    Class: History of    Past Medical History  Diagnosis Date  . Amenorrhea   . Anxiety   . Depression   . Diabetes mellitus without complication   . Dyspareunia   . Hypertension   . Urinary incontinence     Past Surgical History  Procedure Laterality Date  . Carpal tunnel release Right   . Tubal ligation    . Cesarean section      Current Outpatient Prescriptions  Medication Sig Dispense Refill  . amLODipine (NORVASC) 2.5 MG tablet Take 2.5 mg by mouth daily.      Marland Kitchen CALCIUM PO Take by mouth daily.      . Cholecalciferol (VITAMIN D PO) Take by mouth daily.      . clonazePAM (KLONOPIN) 1 MG tablet as needed.      . CRESTOR 10 MG tablet daily.      Marland Kitchen escitalopram (LEXAPRO) 20 MG tablet Take 20 mg by mouth daily.      Marland Kitchen MAGNESIUM PO Take by mouth daily.      .  metFORMIN (GLUCOPHAGE-XR) 750 MG 24 hr tablet Take 750 mg by mouth. Takes 2      . Multiple Vitamins-Minerals (MULTIVITAMIN PO) Take by mouth daily.      . Omega-3 Fatty Acids (FISH OIL PO) Take by mouth daily.      . ONE TOUCH ULTRA TEST test strip       . telmisartan-hydrochlorothiazide (MICARDIS HCT) 80-25 MG per tablet daily.       No current facility-administered medications for this visit.     ALLERGIES: Review of patient's allergies indicates no known allergies.  Family History  Problem Relation Age of Onset  . Diabetes Mother   . Hypertension Mother   . Stroke Mother   . Heart attack Mother   . Hypertension Brother   . Heart attack Brother     History   Social History  . Marital Status: Married    Spouse Name: N/A    Number of Children: N/A  . Years of Education: N/A   Occupational History  . Not on file.   Social History  Main Topics  . Smoking status: Former Research scientist (life sciences)  . Smokeless tobacco: Never Used  . Alcohol Use: 0.6 - 1.2 oz/week    1-2 Glasses of wine per week  . Drug Use: No  . Sexual Activity: Yes    Partners: Male    Birth Control/ Protection: Surgical     Comment: btl   Other Topics Concern  . Not on file   Social History Narrative  . No narrative on file    ROS:  Pertinent items are noted in HPI.  PHYSICAL EXAMINATION:    BP 120/80  Pulse 60  Ht 5' 3.75" (1.619 m)  Wt 211 lb (95.709 kg)  BMI 36.51 kg/m2  LMP 05/13/2010     General appearance: alert, cooperative and appears stated age  Objective   Pelvic ultrasound - images and report reviewed by me with patient   Uterus with EMS 1.82 mm.  10 mm pedunculated fibroid.  Normal ovaries with no follicles.  No free fluid.      Assessment   History of PCOS. Menopause.  Elevated FSH.  Reassuring endometrium. Mood swings controlled on Lexapro. Uterine fibroid.   Plan  Discussed fibroid with patient.  No endometrial biopsy indicated.  Call for any bleeding, even if spotting, so  can be reassessed. Discussed menopause and the benefits of Lexapro for treatment of vasomotor symptoms. Handout on menopause.  Invited patient to return for a consultation with her primary GYN provider, Evalee Mutton. Patient will call if needed.  25 minutes face to face time of which over 50% was spent in counseling.

## 2014-01-13 NOTE — Addendum Note (Signed)
Addended by: Michele Mcalpine on: 01/13/2014 09:49 AM   Modules accepted: Orders

## 2014-03-14 ENCOUNTER — Encounter: Payer: Self-pay | Admitting: Obstetrics and Gynecology

## 2015-04-12 ENCOUNTER — Other Ambulatory Visit: Payer: Self-pay

## 2015-04-12 DIAGNOSIS — Z1231 Encounter for screening mammogram for malignant neoplasm of breast: Secondary | ICD-10-CM

## 2015-04-13 ENCOUNTER — Ambulatory Visit: Payer: Self-pay

## 2015-09-21 ENCOUNTER — Ambulatory Visit: Payer: Self-pay

## 2015-09-28 ENCOUNTER — Ambulatory Visit: Payer: Self-pay

## 2015-10-24 DIAGNOSIS — F321 Major depressive disorder, single episode, moderate: Secondary | ICD-10-CM | POA: Diagnosis not present

## 2015-11-22 DIAGNOSIS — F321 Major depressive disorder, single episode, moderate: Secondary | ICD-10-CM | POA: Diagnosis not present

## 2015-11-23 ENCOUNTER — Ambulatory Visit: Payer: Self-pay

## 2015-11-27 ENCOUNTER — Ambulatory Visit
Admission: RE | Admit: 2015-11-27 | Discharge: 2015-11-27 | Disposition: A | Discharge status: Home / Self Care | Payer: BLUE CROSS/BLUE SHIELD | Source: Ambulatory Visit

## 2015-11-27 DIAGNOSIS — Z1231 Encounter for screening mammogram for malignant neoplasm of breast: Secondary | ICD-10-CM | POA: Diagnosis not present

## 2015-12-20 DIAGNOSIS — F321 Major depressive disorder, single episode, moderate: Secondary | ICD-10-CM | POA: Diagnosis not present

## 2015-12-25 DIAGNOSIS — F322 Major depressive disorder, single episode, severe without psychotic features: Secondary | ICD-10-CM | POA: Diagnosis not present

## 2015-12-28 DIAGNOSIS — F321 Major depressive disorder, single episode, moderate: Secondary | ICD-10-CM | POA: Diagnosis not present

## 2016-01-01 DIAGNOSIS — F321 Major depressive disorder, single episode, moderate: Secondary | ICD-10-CM | POA: Diagnosis not present

## 2016-01-11 DIAGNOSIS — K921 Melena: Secondary | ICD-10-CM | POA: Diagnosis not present

## 2016-01-11 DIAGNOSIS — Z01818 Encounter for other preprocedural examination: Secondary | ICD-10-CM | POA: Diagnosis not present

## 2016-01-11 DIAGNOSIS — F321 Major depressive disorder, single episode, moderate: Secondary | ICD-10-CM | POA: Diagnosis not present

## 2016-01-11 DIAGNOSIS — K5900 Constipation, unspecified: Secondary | ICD-10-CM | POA: Diagnosis not present

## 2016-01-11 DIAGNOSIS — Z1211 Encounter for screening for malignant neoplasm of colon: Secondary | ICD-10-CM | POA: Diagnosis not present

## 2016-01-12 DIAGNOSIS — E559 Vitamin D deficiency, unspecified: Secondary | ICD-10-CM | POA: Diagnosis not present

## 2016-01-12 DIAGNOSIS — E119 Type 2 diabetes mellitus without complications: Secondary | ICD-10-CM | POA: Diagnosis not present

## 2016-01-12 DIAGNOSIS — E782 Mixed hyperlipidemia: Secondary | ICD-10-CM | POA: Diagnosis not present

## 2016-01-18 DIAGNOSIS — F321 Major depressive disorder, single episode, moderate: Secondary | ICD-10-CM | POA: Diagnosis not present

## 2016-01-22 DIAGNOSIS — F321 Major depressive disorder, single episode, moderate: Secondary | ICD-10-CM | POA: Diagnosis not present

## 2016-02-06 DIAGNOSIS — F321 Major depressive disorder, single episode, moderate: Secondary | ICD-10-CM | POA: Diagnosis not present

## 2016-02-11 DIAGNOSIS — Z860100 Personal history of colon polyps, unspecified: Secondary | ICD-10-CM | POA: Insufficient documentation

## 2016-02-14 DIAGNOSIS — K5730 Diverticulosis of large intestine without perforation or abscess without bleeding: Secondary | ICD-10-CM | POA: Diagnosis not present

## 2016-02-14 DIAGNOSIS — D125 Benign neoplasm of sigmoid colon: Secondary | ICD-10-CM | POA: Diagnosis not present

## 2016-02-14 DIAGNOSIS — Z1211 Encounter for screening for malignant neoplasm of colon: Secondary | ICD-10-CM | POA: Diagnosis not present

## 2016-02-14 DIAGNOSIS — K640 First degree hemorrhoids: Secondary | ICD-10-CM | POA: Diagnosis not present

## 2016-02-14 DIAGNOSIS — D126 Benign neoplasm of colon, unspecified: Secondary | ICD-10-CM | POA: Diagnosis not present

## 2016-02-20 DIAGNOSIS — Z1211 Encounter for screening for malignant neoplasm of colon: Secondary | ICD-10-CM | POA: Diagnosis not present

## 2016-02-20 DIAGNOSIS — F321 Major depressive disorder, single episode, moderate: Secondary | ICD-10-CM | POA: Diagnosis not present

## 2016-02-20 DIAGNOSIS — D126 Benign neoplasm of colon, unspecified: Secondary | ICD-10-CM | POA: Diagnosis not present

## 2016-03-05 DIAGNOSIS — F321 Major depressive disorder, single episode, moderate: Secondary | ICD-10-CM | POA: Diagnosis not present

## 2016-03-12 DIAGNOSIS — F321 Major depressive disorder, single episode, moderate: Secondary | ICD-10-CM | POA: Diagnosis not present

## 2016-03-18 DIAGNOSIS — F321 Major depressive disorder, single episode, moderate: Secondary | ICD-10-CM | POA: Diagnosis not present

## 2016-03-27 DIAGNOSIS — F321 Major depressive disorder, single episode, moderate: Secondary | ICD-10-CM | POA: Diagnosis not present

## 2016-04-09 DIAGNOSIS — F321 Major depressive disorder, single episode, moderate: Secondary | ICD-10-CM | POA: Diagnosis not present

## 2016-04-22 DIAGNOSIS — F321 Major depressive disorder, single episode, moderate: Secondary | ICD-10-CM | POA: Diagnosis not present

## 2016-05-15 DIAGNOSIS — F321 Major depressive disorder, single episode, moderate: Secondary | ICD-10-CM | POA: Diagnosis not present

## 2016-05-16 DIAGNOSIS — F321 Major depressive disorder, single episode, moderate: Secondary | ICD-10-CM | POA: Diagnosis not present

## 2016-05-20 DIAGNOSIS — F322 Major depressive disorder, single episode, severe without psychotic features: Secondary | ICD-10-CM | POA: Diagnosis not present

## 2016-05-21 DIAGNOSIS — F321 Major depressive disorder, single episode, moderate: Secondary | ICD-10-CM | POA: Diagnosis not present

## 2016-05-30 DIAGNOSIS — F321 Major depressive disorder, single episode, moderate: Secondary | ICD-10-CM | POA: Diagnosis not present

## 2016-06-03 DIAGNOSIS — F321 Major depressive disorder, single episode, moderate: Secondary | ICD-10-CM | POA: Diagnosis not present

## 2016-06-10 DIAGNOSIS — F321 Major depressive disorder, single episode, moderate: Secondary | ICD-10-CM | POA: Diagnosis not present

## 2016-06-11 ENCOUNTER — Ambulatory Visit (INDEPENDENT_AMBULATORY_CARE_PROVIDER_SITE_OTHER): Payer: BLUE CROSS/BLUE SHIELD | Admitting: Certified Nurse Midwife

## 2016-06-11 ENCOUNTER — Encounter: Payer: Self-pay | Admitting: Certified Nurse Midwife

## 2016-06-11 VITALS — BP 118/78 | Ht 63.0 in | Wt 205.0 lb

## 2016-06-11 DIAGNOSIS — Z124 Encounter for screening for malignant neoplasm of cervix: Secondary | ICD-10-CM

## 2016-06-11 DIAGNOSIS — N950 Postmenopausal bleeding: Secondary | ICD-10-CM

## 2016-06-11 DIAGNOSIS — Z01419 Encounter for gynecological examination (general) (routine) without abnormal findings: Secondary | ICD-10-CM

## 2016-06-11 DIAGNOSIS — Z0000 Encounter for general adult medical examination without abnormal findings: Secondary | ICD-10-CM | POA: Diagnosis not present

## 2016-06-11 LAB — HEMOGLOBIN, FINGERSTICK: HEMOGLOBIN, FINGERSTICK: 12.1 g/dL (ref 12.0–15.0)

## 2016-06-11 LAB — POCT URINALYSIS DIPSTICK
BILIRUBIN UA: NEGATIVE
Glucose, UA: NEGATIVE
Ketones, UA: NEGATIVE
LEUKOCYTES UA: NEGATIVE
NITRITE UA: NEGATIVE
PROTEIN UA: NEGATIVE
Urobilinogen, UA: NEGATIVE
pH, UA: 5

## 2016-06-11 LAB — TSH: TSH: 2.23 m[IU]/L

## 2016-06-11 NOTE — Patient Instructions (Signed)

## 2016-06-11 NOTE — Progress Notes (Signed)
51 y.o. G47P1011 Married  Caucasian Fe menopausal here for annual exam. Has had hot flashes and night sweats over the past few months.  Some vaginal dryness. Had cramping in 1/18 and then had vaginal bleeding the past three days, like a period. Last occurrence with spotting was in  2015. Patient has an increase amount of stress with 7 deaths in family and friends, plus moved Dad to assisted living facility.  Sees Dr. Zenovia Jordan for labs/aex and medication management for glucose,cholesterol,hypertension, anxiety/depression.. Patient has had some weight gain also.  Patient's last menstrual period was 06/09/2016 (exact date).          Sexually active: Yes.    The current method of family planning is tubal ligation.    Exercising: No.  exercise Smoker:  no  Health Maintenance: Pap:  12/14 neg per patient MMG:  11-27-15 category b density birads 1:neg Colonoscopy:  2017 polyps f/u 72yrs BMD:   none TDaP:  2012 Shingles: no Pneumonia: no Hep C and HIV: not done Labs: hgb-12.1, poct urine-rbc tr Self breast exam: done occ   reports that she has quit smoking. She has never used smokeless tobacco. She reports that she does not drink alcohol or use drugs.  Past Medical History:  Diagnosis Date  . Amenorrhea   . Anxiety   . Depression   . Diabetes mellitus without complication ([place])   . Dyspareunia   . Hypertension   . Urinary incontinence     Past Surgical History:  Procedure Laterality Date  . CARPAL TUNNEL RELEASE Right   . CESAREAN SECTION    . TUBAL LIGATION      Current Outpatient Prescriptions  Medication Sig Dispense Refill  . amLODipine (NORVASC) 2.5 MG tablet Take 2.5 mg by mouth daily.    . BD PEN NEEDLE NANO U/F 32G X 4 MM MISC     . buPROPion (WELLBUTRIN XL) 300 MG 24 hr tablet     . CALCIUM PO Take by mouth daily.    . Cholecalciferol (VITAMIN D PO) Take by mouth daily.    . CRESTOR 10 MG tablet daily.    Marland Kitchen doxycycline (VIBRAMYCIN) 100 MG capsule     . escitalopram  (LEXAPRO) 20 MG tablet Take 20 mg by mouth daily.    Marland Kitchen MAGNESIUM PO Take by mouth daily.    . metFORMIN (GLUCOPHAGE-XR) 750 MG 24 hr tablet Take 750 mg by mouth. Takes 2    . Multiple Vitamins-Minerals (MULTIVITAMIN PO) Take by mouth daily.    . Omega-3 Fatty Acids (FISH OIL PO) Take by mouth daily.    . ONE TOUCH ULTRA TEST test strip     . telmisartan-hydrochlorothiazide (MICARDIS HCT) 80-25 MG per tablet daily.    Marland Kitchen VICTOZA 18 MG/3ML SOPN      No current facility-administered medications for this visit.     Family History  Problem Relation Age of Onset  . Diabetes Mother   . Hypertension Mother   . Stroke Mother   . Heart attack Mother   . Hypertension Brother   . Heart attack Brother     ROS:  Pertinent items are noted in HPI.  Otherwise, a comprehensive ROS was negative.  Exam:   BP 118/78   Ht 5\' 3"  (1.6 m)   Wt 205 lb (93 kg)   LMP 06/09/2016 (Exact Date) Comment: spotting  BMI 36.31 kg/m  Height: 5\' 3"  (160 cm) Ht Readings from Last 3 Encounters:  06/11/16 5\' 3"  (1.6 m)  01/13/14  5' 3.75" (1.619 m)  12/15/13 5' 3.75" (1.619 m)    General appearance: alert, cooperative and appears stated age Head: Normocephalic, without obvious abnormality, atraumatic Neck: no adenopathy, supple, symmetrical, trachea midline and thyroid normal to inspection and palpation Lungs: clear to auscultation bilaterally Breasts: normal appearance, no masses or tenderness, No nipple retraction or dimpling, No nipple discharge or bleeding, No axillary or supraclavicular adenopathy Heart: regular rate and rhythm Abdomen: soft, non-tender; no masses,  no organomegaly Extremities: extremities normal, atraumatic, no cyanosis or edema Skin: Skin color, texture, turgor normal. No rashes or lesions Lymph nodes: Cervical, supraclavicular, and axillary nodes normal. No abnormal inguinal nodes palpated Neurologic: Grossly normal   Pelvic: External genitalia:  no lesions              Urethra:   normal appearing urethra with no masses, tenderness or lesions              Bartholin's and Skene's: normal                 Vagina: normal appearing vagina with normal color and discharge, no lesions              Cervix: multiparous appearance, no cervical motion tenderness, no lesions and blood noted from cervix              Pap taken: Yes.   Bimanual Exam:  Uterus:  normal size, contour, position, consistency, mobility, non-tender              Adnexa: normal adnexa and no mass, fullness, tenderness               Rectovaginal: Confirms               Anus:  normal sphincter tone, no lesions  Chaperone present: yes  A:  Well Woman with normal exam  Menopausal with post menopausal bleeding  Social stress with deaths in family and friends  Hypertension, Diabetes, management with PCP    P:   Reviewed health and wellness pertinent to exam  Discussed need for evaluation of PMB as before when occurred in 2015. Patient agreeable to PUS and SHGM or Endo biopsy as indicated. Will be contacted by office with insurance info and scheduled  Lab: FSH, TSH  Continue follow up as indicated.  Pap smear as above with HPVHR    counseled on breast self exam, mammography screening, menopause, adequate intake of calcium and vitamin D, diet and exercise  return annually or prn  An After Visit Summary was printed and given to the patient.

## 2016-06-12 ENCOUNTER — Telehealth: Payer: Self-pay

## 2016-06-12 DIAGNOSIS — N950 Postmenopausal bleeding: Secondary | ICD-10-CM

## 2016-06-12 DIAGNOSIS — Z780 Asymptomatic menopausal state: Secondary | ICD-10-CM

## 2016-06-12 LAB — FOLLICLE STIMULATING HORMONE: FSH: 30.7 m[IU]/mL

## 2016-06-12 NOTE — Progress Notes (Signed)
Encounter reviewed Gabrielle Halvorson, MD   

## 2016-06-12 NOTE — Telephone Encounter (Signed)
Spoke with patient. Advised of results and message as seen below from [facility]. Patient is agreeable and verbalizes understanding. Appointment for [facility] and possible EMB scheduled for 06/18/2016 at 1 pm with 1:30 pm consult with Dr.Jertson. Pre procedure instructions given.  Motrin instructions given. Motrin=Advil=Ibuprofen, 800 mg one hour before appointment. Eat a meal and hydrate well before appointment. Patient is agreeable to date and time. Orders placed under Dr.Jertson for precert and linking to appointment.   Cc: Dr.Jertson  Routing to provider for final review. Patient agreeable to disposition. Will close encounter.

## 2016-06-12 NOTE — Telephone Encounter (Signed)
-----   Message from Regina Eck, CNM sent at 06/12/2016  8:02 AM EST ----- Notify patient that [facility] is still in menopausal range. TSH is normal Will need evaluate as we discussed for post menopausal bleeding. I discussed with Dr. Talbert Nan and she agrees that [place] and sonohystogram should be done together and if biopsy needed will do at the same time. Sometimes bleeding can be from a polyp inside also. This will help better assess this. Order placed will be called with insurance info and scheduled

## 2016-06-13 ENCOUNTER — Telehealth: Payer: Self-pay | Admitting: Obstetrics and Gynecology

## 2016-06-13 NOTE — Telephone Encounter (Signed)
Called patient to review benefits for a recommended sonohysterogram. Left Voicemail requesting a call back. °

## 2016-06-17 DIAGNOSIS — F321 Major depressive disorder, single episode, moderate: Secondary | ICD-10-CM | POA: Diagnosis not present

## 2016-06-17 LAB — IPS PAP TEST WITH HPV

## 2016-06-18 ENCOUNTER — Other Ambulatory Visit: Payer: BLUE CROSS/BLUE SHIELD | Admitting: Obstetrics and Gynecology

## 2016-06-18 ENCOUNTER — Other Ambulatory Visit: Payer: Self-pay

## 2016-06-18 ENCOUNTER — Other Ambulatory Visit: Payer: BLUE CROSS/BLUE SHIELD

## 2016-06-18 MED ORDER — TERCONAZOLE 0.4 % VA CREA: TOPICAL_CREAM | VAGINAL | 0 refills | Status: DC

## 2016-06-25 ENCOUNTER — Ambulatory Visit (INDEPENDENT_AMBULATORY_CARE_PROVIDER_SITE_OTHER): Payer: BLUE CROSS/BLUE SHIELD | Admitting: Obstetrics and Gynecology

## 2016-06-25 ENCOUNTER — Other Ambulatory Visit: Payer: Self-pay | Admitting: Obstetrics and Gynecology

## 2016-06-25 ENCOUNTER — Ambulatory Visit (INDEPENDENT_AMBULATORY_CARE_PROVIDER_SITE_OTHER): Payer: BLUE CROSS/BLUE SHIELD

## 2016-06-25 ENCOUNTER — Encounter: Payer: Self-pay | Admitting: Obstetrics and Gynecology

## 2016-06-25 DIAGNOSIS — N950 Postmenopausal bleeding: Secondary | ICD-10-CM | POA: Diagnosis not present

## 2016-06-25 NOTE — Progress Notes (Signed)
GYNECOLOGY  VISIT   HPI: 51 y.o.   Married  Caucasian  female   G2P1011 with Patient's last menstrual period was 06/09/2016 (exact date).   here for F/U PMB. The patient reports a 3 day h/o light vaginal bleeding in January. It felt like a cycle to her with the typical menstrual cramps. She hadn't had any bleeding for years. She does report being under a large amount of stress (has lost multiple loved ones, including her brother and had to move her Dad into assisted living). No further bleeding.    GYNECOLOGIC HISTORY: Patient's last menstrual period was 06/09/2016 (exact date). Contraception:postmenpause Menopausal hormone therapy: none         OB History    Gravida Para Term Preterm AB Living   2 1 1   1 1    SAB TAB Ectopic Multiple Live Births     1     1         Patient Active Problem List   Diagnosis Date Noted  . Type II or unspecified type diabetes mellitus without mention of complication, not stated as uncontrolled 12/15/2013    Class: History of  . Accelerated hypertension 12/15/2013  . Elevated cholesterol 12/15/2013    Class: History of    Past Medical History:  Diagnosis Date  . Amenorrhea   . Anxiety   . Depression   . Diabetes mellitus without complication ([place])   . Dyspareunia   . Hypertension   . Urinary incontinence     Past Surgical History:  Procedure Laterality Date  . CARPAL TUNNEL RELEASE Right   . CESAREAN SECTION    . TUBAL LIGATION      Current Outpatient Prescriptions  Medication Sig Dispense Refill  . amLODipine (NORVASC) 2.5 MG tablet Take 2.5 mg by mouth daily.    . BD PEN NEEDLE NANO U/F 32G X 4 MM MISC     . buPROPion (WELLBUTRIN XL) 300 MG 24 hr tablet     . CALCIUM PO Take by mouth daily.    . Cholecalciferol (VITAMIN D PO) Take by mouth daily.    . CRESTOR 10 MG tablet daily.    Marland Kitchen doxycycline (VIBRAMYCIN) 100 MG capsule     . escitalopram (LEXAPRO) 20 MG tablet Take 20 mg by mouth daily.    Marland Kitchen MAGNESIUM PO Take by mouth daily.     . metFORMIN (GLUCOPHAGE-XR) 750 MG 24 hr tablet Take 750 mg by mouth. Takes 2    . Multiple Vitamins-Minerals (MULTIVITAMIN PO) Take by mouth daily.    . Omega-3 Fatty Acids (FISH OIL PO) Take by mouth daily.    . ONE TOUCH ULTRA TEST test strip     . telmisartan-hydrochlorothiazide (MICARDIS HCT) 80-25 MG per tablet daily.    Marland Kitchen terconazole (TERAZOL 7) 0.4 % vaginal cream Use 1 applicator full vaginally for 7 nights 45 g 0  . VICTOZA 18 MG/3ML SOPN      No current facility-administered medications for this visit.      ALLERGIES: Patient has no known allergies.  Family History  Problem Relation Age of Onset  . Diabetes Mother   . Hypertension Mother   . Stroke Mother   . Heart attack Mother   . Hypertension Brother   . Heart attack Brother     Social History   Social History  . Marital status: Married    Spouse name: N/A  . Number of children: N/A  . Years of education: N/A   Occupational History  .  Not on file.   Social History Main Topics  . Smoking status: Former Research scientist (life sciences)  . Smokeless tobacco: Never Used  . Alcohol use No  . Drug use: No  . Sexual activity: Yes    Partners: Male    Birth control/ protection: Surgical     Comment: btl   Other Topics Concern  . Not on file   Social History Narrative  . No narrative on file    Review of Systems  Constitutional: Negative.   HENT: Negative.   Eyes: Negative.   Respiratory: Negative.   Cardiovascular: Negative.   Gastrointestinal: Negative.   Genitourinary: Negative.   Musculoskeletal: Negative.   Skin: Negative.   Neurological: Negative.   Endo/Heme/Allergies: Negative.   Psychiatric/Behavioral: Negative.     PHYSICAL EXAMINATION:    BP 138/76 (BP Location: Right Arm, Patient Position: Sitting, Cuff Size: Normal)   Pulse 76   Resp 16   Wt 205 lb (93 kg)   LMP 06/09/2016 (Exact Date) Comment: spotting  BMI 36.31 kg/m     General appearance: alert, cooperative and appears stated age  Ultrasound  images reviewed: very thin, uniform endometrial stripe.   ASSESSMENT Episode of PMP bleeding, very thin lining on ultrasound    PLAN Recommended she call with any further bleeding. If she bleeds again she will need further evaluation   An After Visit Summary was printed and given to the patient.  CC: Gabrielle Russell

## 2016-07-04 NOTE — Telephone Encounter (Signed)
Appointment completed on 06/25/16. Ok to close

## 2016-07-11 DIAGNOSIS — F321 Major depressive disorder, single episode, moderate: Secondary | ICD-10-CM | POA: Diagnosis not present

## 2016-07-25 DIAGNOSIS — F321 Major depressive disorder, single episode, moderate: Secondary | ICD-10-CM | POA: Diagnosis not present

## 2016-08-01 DIAGNOSIS — F321 Major depressive disorder, single episode, moderate: Secondary | ICD-10-CM | POA: Diagnosis not present

## 2016-08-14 DIAGNOSIS — F321 Major depressive disorder, single episode, moderate: Secondary | ICD-10-CM | POA: Diagnosis not present

## 2016-08-22 DIAGNOSIS — F321 Major depressive disorder, single episode, moderate: Secondary | ICD-10-CM | POA: Diagnosis not present

## 2016-08-26 DIAGNOSIS — F321 Major depressive disorder, single episode, moderate: Secondary | ICD-10-CM | POA: Diagnosis not present

## 2016-09-05 DIAGNOSIS — F321 Major depressive disorder, single episode, moderate: Secondary | ICD-10-CM | POA: Diagnosis not present

## 2016-09-11 DIAGNOSIS — F321 Major depressive disorder, single episode, moderate: Secondary | ICD-10-CM | POA: Diagnosis not present

## 2016-09-11 DIAGNOSIS — E119 Type 2 diabetes mellitus without complications: Secondary | ICD-10-CM | POA: Diagnosis not present

## 2016-09-18 DIAGNOSIS — I10 Essential (primary) hypertension: Secondary | ICD-10-CM | POA: Diagnosis not present

## 2016-09-18 DIAGNOSIS — E782 Mixed hyperlipidemia: Secondary | ICD-10-CM | POA: Diagnosis not present

## 2016-09-18 DIAGNOSIS — E119 Type 2 diabetes mellitus without complications: Secondary | ICD-10-CM | POA: Diagnosis not present

## 2016-09-23 DIAGNOSIS — F322 Major depressive disorder, single episode, severe without psychotic features: Secondary | ICD-10-CM | POA: Diagnosis not present

## 2016-10-02 DIAGNOSIS — F321 Major depressive disorder, single episode, moderate: Secondary | ICD-10-CM | POA: Diagnosis not present

## 2016-10-09 DIAGNOSIS — F321 Major depressive disorder, single episode, moderate: Secondary | ICD-10-CM | POA: Diagnosis not present

## 2016-10-23 DIAGNOSIS — F321 Major depressive disorder, single episode, moderate: Secondary | ICD-10-CM | POA: Diagnosis not present

## 2016-12-23 ENCOUNTER — Other Ambulatory Visit: Payer: Self-pay | Admitting: Family Medicine

## 2016-12-23 DIAGNOSIS — Z1231 Encounter for screening mammogram for malignant neoplasm of breast: Secondary | ICD-10-CM

## 2016-12-27 ENCOUNTER — Ambulatory Visit: Payer: BLUE CROSS/BLUE SHIELD

## 2017-01-28 DIAGNOSIS — F322 Major depressive disorder, single episode, severe without psychotic features: Secondary | ICD-10-CM | POA: Diagnosis not present

## 2017-06-12 NOTE — Progress Notes (Deleted)
52 y.o. G2P1011 Married  {Race/ethnicity:17218} Fe here for annual exam.    No LMP recorded.          Sexually active: {yes no:314532}  The current method of family planning is tubal ligation.    Exercising: {yes no:314532}  {types:19826} Smoker:  {YES NO:22349}  Health Maintenance: Pap:  12/14 neg per patient, 06-11-16 neg HPV HR neg History of Abnormal Pap: {YES NO:22349} MMG:  11-27-15 category b density birads 1:neg Self Breast exams: {YES NO:22349} Colonoscopy:  2017 polyps f/u 70yrs BMD:   none TDaP:  2012 Shingles: no Pneumonia: no Hep C and HIV: not done Labs: ***   reports that she has quit smoking. she has never used smokeless tobacco. She reports that she does not drink alcohol or use drugs.  Past Medical History:  Diagnosis Date  . Amenorrhea   . Anxiety   . Depression   . Diabetes mellitus without complication ([place])   . Dyspareunia   . Hypertension   . Urinary incontinence     Past Surgical History:  Procedure Laterality Date  . CARPAL TUNNEL RELEASE Right   . CESAREAN SECTION    . TUBAL LIGATION      Current Outpatient Medications  Medication Sig Dispense Refill  . amLODipine (NORVASC) 2.5 MG tablet Take 2.5 mg by mouth daily.    . BD PEN NEEDLE NANO U/F 32G X 4 MM MISC     . buPROPion (WELLBUTRIN XL) 300 MG 24 hr tablet     . CALCIUM PO Take by mouth daily.    . Cholecalciferol (VITAMIN D PO) Take by mouth daily.    . CRESTOR 10 MG tablet daily.    Marland Kitchen doxycycline (VIBRAMYCIN) 100 MG capsule     . escitalopram (LEXAPRO) 20 MG tablet Take 20 mg by mouth daily.    Marland Kitchen MAGNESIUM PO Take by mouth daily.    . metFORMIN (GLUCOPHAGE-XR) 750 MG 24 hr tablet Take 750 mg by mouth. Takes 2    . Multiple Vitamins-Minerals (MULTIVITAMIN PO) Take by mouth daily.    . Omega-3 Fatty Acids (FISH OIL PO) Take by mouth daily.    . ONE TOUCH ULTRA TEST test strip     . telmisartan-hydrochlorothiazide (MICARDIS HCT) 80-25 MG per tablet daily.    Marland Kitchen terconazole (TERAZOL 7)  0.4 % vaginal cream Use 1 applicator full vaginally for 7 nights 45 g 0  . VICTOZA 18 MG/3ML SOPN      No current facility-administered medications for this visit.     Family History  Problem Relation Age of Onset  . Diabetes Mother   . Hypertension Mother   . Stroke Mother   . Heart attack Mother   . Hypertension Brother   . Heart attack Brother     ROS:  Pertinent items are noted in HPI.  Otherwise, a comprehensive ROS was negative.  Exam:   There were no vitals taken for this visit.   Ht Readings from Last 3 Encounters:  06/11/16 5\' 3"  (1.6 m)  01/13/14 5' 3.75" (1.619 m)  12/15/13 5' 3.75" (1.619 m)    General appearance: alert, cooperative and appears stated age Head: Normocephalic, without obvious abnormality, atraumatic Neck: no adenopathy, supple, symmetrical, trachea midline and thyroid {EXAM; THYROID:18604} Lungs: clear to auscultation bilaterally Breasts: {Exam; breast:13139::"normal appearance, no masses or tenderness"} Heart: regular rate and rhythm Abdomen: soft, non-tender; no masses,  no organomegaly Extremities: extremities normal, atraumatic, no cyanosis or edema Skin: Skin color, texture, turgor normal. No rashes  or lesions Lymph nodes: Cervical, supraclavicular, and axillary nodes normal. No abnormal inguinal nodes palpated Neurologic: Grossly normal   Pelvic: External genitalia:  no lesions              Urethra:  normal appearing urethra with no masses, tenderness or lesions              Bartholin's and Skene's: normal                 Vagina: normal appearing vagina with normal color and discharge, no lesions              Cervix: {exam; cervix:14595}              Pap taken: {yes no:314532} Bimanual Exam:  Uterus:  {exam; uterus:12215}              Adnexa: {exam; adnexa:12223}               Rectovaginal: Confirms               Anus:  normal sphincter tone, no lesions  Chaperone present: ***  A:  Well Woman with normal exam  P:   Reviewed  health and wellness pertinent to exam  Pap smear: {YES NO:22349}  {plan; gyn:5269::"mammogram","pap smear","return annually or prn"}  An After Visit Summary was printed and given to the patient.

## 2017-06-13 ENCOUNTER — Ambulatory Visit: Payer: BLUE CROSS/BLUE SHIELD | Admitting: Certified Nurse Midwife

## 2017-06-13 ENCOUNTER — Encounter: Payer: Self-pay | Admitting: Certified Nurse Midwife

## 2017-06-30 DIAGNOSIS — F322 Major depressive disorder, single episode, severe without psychotic features: Secondary | ICD-10-CM | POA: Diagnosis not present

## 2017-07-30 ENCOUNTER — Ambulatory Visit: Payer: BLUE CROSS/BLUE SHIELD | Admitting: Certified Nurse Midwife

## 2017-08-04 DIAGNOSIS — F321 Major depressive disorder, single episode, moderate: Secondary | ICD-10-CM | POA: Diagnosis not present

## 2017-08-11 DIAGNOSIS — F321 Major depressive disorder, single episode, moderate: Secondary | ICD-10-CM | POA: Diagnosis not present

## 2017-09-12 DIAGNOSIS — E1165 Type 2 diabetes mellitus with hyperglycemia: Secondary | ICD-10-CM | POA: Diagnosis not present

## 2017-09-12 DIAGNOSIS — Z23 Encounter for immunization: Secondary | ICD-10-CM | POA: Diagnosis not present

## 2017-09-12 DIAGNOSIS — F3341 Major depressive disorder, recurrent, in partial remission: Secondary | ICD-10-CM | POA: Diagnosis not present

## 2017-09-12 DIAGNOSIS — I10 Essential (primary) hypertension: Secondary | ICD-10-CM | POA: Diagnosis not present

## 2017-09-12 DIAGNOSIS — E782 Mixed hyperlipidemia: Secondary | ICD-10-CM | POA: Diagnosis not present

## 2017-09-12 DIAGNOSIS — E559 Vitamin D deficiency, unspecified: Secondary | ICD-10-CM | POA: Diagnosis not present

## 2017-09-23 ENCOUNTER — Ambulatory Visit: Payer: BLUE CROSS/BLUE SHIELD | Admitting: Certified Nurse Midwife

## 2017-09-23 ENCOUNTER — Telehealth: Payer: Self-pay | Admitting: Certified Nurse Midwife

## 2017-09-23 NOTE — Progress Notes (Deleted)
52 y.o. G2P1011 Married  {Race/ethnicity:17218} Fe here for annual exam.    No LMP recorded.          Sexually active: {yes no:314532}  The current method of family planning is tubal ligation.    Exercising: {yes no:314532}  {types:19826} Smoker:  {YES NO:22349}  Health Maintenance: Pap:  12/14 neg per patient, 06-11-16 neg HPV HR neg History of Abnormal Pap: {YES NO:22349} MMG:  11-27-15 category b density birads 1:neg Self Breast exams: {YES NO:22349} Colonoscopy:  2017 polyps f/u 37yrs BMD:   none TDaP:  2012 Shingles: no Pneumonia: no Hep C and HIV: not done Labs: ***   reports that she has quit smoking. She has never used smokeless tobacco. She reports that she does not drink alcohol or use drugs.  Past Medical History:  Diagnosis Date  . Amenorrhea   . Anxiety   . Depression   . Diabetes mellitus without complication ([place])   . Dyspareunia   . Hypertension   . Urinary incontinence     Past Surgical History:  Procedure Laterality Date  . CARPAL TUNNEL RELEASE Right   . CESAREAN SECTION    . TUBAL LIGATION      Current Outpatient Medications  Medication Sig Dispense Refill  . amLODipine (NORVASC) 2.5 MG tablet Take 2.5 mg by mouth daily.    . BD PEN NEEDLE NANO U/F 32G X 4 MM MISC     . buPROPion (WELLBUTRIN XL) 300 MG 24 hr tablet     . CALCIUM PO Take by mouth daily.    . Cholecalciferol (VITAMIN D PO) Take by mouth daily.    . CRESTOR 10 MG tablet daily.    Marland Kitchen doxycycline (VIBRAMYCIN) 100 MG capsule     . escitalopram (LEXAPRO) 20 MG tablet Take 20 mg by mouth daily.    Marland Kitchen MAGNESIUM PO Take by mouth daily.    . metFORMIN (GLUCOPHAGE-XR) 750 MG 24 hr tablet Take 750 mg by mouth. Takes 2    . Multiple Vitamins-Minerals (MULTIVITAMIN PO) Take by mouth daily.    . Omega-3 Fatty Acids (FISH OIL PO) Take by mouth daily.    . ONE TOUCH ULTRA TEST test strip     . telmisartan-hydrochlorothiazide (MICARDIS HCT) 80-25 MG per tablet daily.    Marland Kitchen terconazole (TERAZOL 7)  0.4 % vaginal cream Use 1 applicator full vaginally for 7 nights 45 g 0  . VICTOZA 18 MG/3ML SOPN      No current facility-administered medications for this visit.     Family History  Problem Relation Age of Onset  . Diabetes Mother   . Hypertension Mother   . Stroke Mother   . Heart attack Mother   . Hypertension Brother   . Heart attack Brother     ROS:  Pertinent items are noted in HPI.  Otherwise, a comprehensive ROS was negative.  Exam:   There were no vitals taken for this visit.   Ht Readings from Last 3 Encounters:  06/11/16 5\' 3"  (1.6 m)  01/13/14 5' 3.75" (1.619 m)  12/15/13 5' 3.75" (1.619 m)    General appearance: alert, cooperative and appears stated age Head: Normocephalic, without obvious abnormality, atraumatic Neck: no adenopathy, supple, symmetrical, trachea midline and thyroid {EXAM; THYROID:18604} Lungs: clear to auscultation bilaterally Breasts: {Exam; breast:13139::"normal appearance, no masses or tenderness"} Heart: regular rate and rhythm Abdomen: soft, non-tender; no masses,  no organomegaly Extremities: extremities normal, atraumatic, no cyanosis or edema Skin: Skin color, texture, turgor normal. No rashes  or lesions Lymph nodes: Cervical, supraclavicular, and axillary nodes normal. No abnormal inguinal nodes palpated Neurologic: Grossly normal   Pelvic: External genitalia:  no lesions              Urethra:  normal appearing urethra with no masses, tenderness or lesions              Bartholin's and Skene's: normal                 Vagina: normal appearing vagina with normal color and discharge, no lesions              Cervix: {exam; cervix:14595}              Pap taken: {yes no:314532} Bimanual Exam:  Uterus:  {exam; uterus:12215}              Adnexa: {exam; adnexa:12223}               Rectovaginal: Confirms               Anus:  normal sphincter tone, no lesions  Chaperone present: ***  A:  Well Woman with normal exam  P:   Reviewed  health and wellness pertinent to exam  Pap smear: {YES NO:22349}  {plan; gyn:5269::"mammogram","pap smear","return annually or prn"}  An After Visit Summary was printed and given to the patient.

## 2017-09-23 NOTE — Telephone Encounter (Signed)
Patient canceled her aex today. Patient is "on the way to ER for her father". Patient will call later to reschedule.

## 2017-11-27 DIAGNOSIS — H9202 Otalgia, left ear: Secondary | ICD-10-CM | POA: Diagnosis not present

## 2017-11-27 DIAGNOSIS — Z6836 Body mass index (BMI) 36.0-36.9, adult: Secondary | ICD-10-CM | POA: Diagnosis not present

## 2018-01-05 DIAGNOSIS — F322 Major depressive disorder, single episode, severe without psychotic features: Secondary | ICD-10-CM | POA: Diagnosis not present

## 2018-01-30 DIAGNOSIS — E1165 Type 2 diabetes mellitus with hyperglycemia: Secondary | ICD-10-CM | POA: Diagnosis not present

## 2018-02-03 DIAGNOSIS — E1165 Type 2 diabetes mellitus with hyperglycemia: Secondary | ICD-10-CM | POA: Diagnosis not present

## 2018-02-03 DIAGNOSIS — I10 Essential (primary) hypertension: Secondary | ICD-10-CM | POA: Diagnosis not present

## 2018-02-03 DIAGNOSIS — E782 Mixed hyperlipidemia: Secondary | ICD-10-CM | POA: Diagnosis not present

## 2018-02-03 DIAGNOSIS — Z23 Encounter for immunization: Secondary | ICD-10-CM | POA: Diagnosis not present

## 2018-02-12 DIAGNOSIS — F321 Major depressive disorder, single episode, moderate: Secondary | ICD-10-CM | POA: Diagnosis not present

## 2018-02-19 DIAGNOSIS — F321 Major depressive disorder, single episode, moderate: Secondary | ICD-10-CM | POA: Diagnosis not present

## 2018-07-06 DIAGNOSIS — F322 Major depressive disorder, single episode, severe without psychotic features: Secondary | ICD-10-CM | POA: Diagnosis not present

## 2018-07-10 DIAGNOSIS — F411 Generalized anxiety disorder: Secondary | ICD-10-CM | POA: Diagnosis not present

## 2018-07-10 DIAGNOSIS — F320 Major depressive disorder, single episode, mild: Secondary | ICD-10-CM | POA: Diagnosis not present

## 2018-07-20 DIAGNOSIS — F320 Major depressive disorder, single episode, mild: Secondary | ICD-10-CM | POA: Diagnosis not present

## 2018-07-20 DIAGNOSIS — F411 Generalized anxiety disorder: Secondary | ICD-10-CM | POA: Diagnosis not present

## 2018-08-18 DIAGNOSIS — E1165 Type 2 diabetes mellitus with hyperglycemia: Secondary | ICD-10-CM | POA: Diagnosis not present

## 2018-08-18 DIAGNOSIS — I10 Essential (primary) hypertension: Secondary | ICD-10-CM | POA: Diagnosis not present

## 2018-08-18 DIAGNOSIS — E782 Mixed hyperlipidemia: Secondary | ICD-10-CM | POA: Diagnosis not present

## 2018-08-24 DIAGNOSIS — E782 Mixed hyperlipidemia: Secondary | ICD-10-CM | POA: Diagnosis not present

## 2018-08-24 DIAGNOSIS — E559 Vitamin D deficiency, unspecified: Secondary | ICD-10-CM | POA: Diagnosis not present

## 2018-08-24 DIAGNOSIS — E1165 Type 2 diabetes mellitus with hyperglycemia: Secondary | ICD-10-CM | POA: Diagnosis not present

## 2018-08-24 DIAGNOSIS — I10 Essential (primary) hypertension: Secondary | ICD-10-CM | POA: Diagnosis not present

## 2018-09-25 DIAGNOSIS — F322 Major depressive disorder, single episode, severe without psychotic features: Secondary | ICD-10-CM | POA: Diagnosis not present

## 2018-10-26 DIAGNOSIS — F322 Major depressive disorder, single episode, severe without psychotic features: Secondary | ICD-10-CM | POA: Diagnosis not present

## 2018-10-30 DIAGNOSIS — M67912 Unspecified disorder of synovium and tendon, left shoulder: Secondary | ICD-10-CM | POA: Diagnosis not present

## 2018-11-25 DIAGNOSIS — M67912 Unspecified disorder of synovium and tendon, left shoulder: Secondary | ICD-10-CM | POA: Diagnosis not present

## 2018-11-30 DIAGNOSIS — M25612 Stiffness of left shoulder, not elsewhere classified: Secondary | ICD-10-CM | POA: Diagnosis not present

## 2018-11-30 DIAGNOSIS — S46012D Strain of muscle(s) and tendon(s) of the rotator cuff of left shoulder, subsequent encounter: Secondary | ICD-10-CM | POA: Diagnosis not present

## 2018-12-07 DIAGNOSIS — S46012D Strain of muscle(s) and tendon(s) of the rotator cuff of left shoulder, subsequent encounter: Secondary | ICD-10-CM | POA: Diagnosis not present

## 2018-12-07 DIAGNOSIS — M25612 Stiffness of left shoulder, not elsewhere classified: Secondary | ICD-10-CM | POA: Diagnosis not present

## 2018-12-11 DIAGNOSIS — E1165 Type 2 diabetes mellitus with hyperglycemia: Secondary | ICD-10-CM | POA: Diagnosis not present

## 2018-12-11 DIAGNOSIS — E559 Vitamin D deficiency, unspecified: Secondary | ICD-10-CM | POA: Diagnosis not present

## 2018-12-21 DIAGNOSIS — S46012D Strain of muscle(s) and tendon(s) of the rotator cuff of left shoulder, subsequent encounter: Secondary | ICD-10-CM | POA: Diagnosis not present

## 2018-12-21 DIAGNOSIS — I10 Essential (primary) hypertension: Secondary | ICD-10-CM | POA: Diagnosis not present

## 2018-12-21 DIAGNOSIS — E782 Mixed hyperlipidemia: Secondary | ICD-10-CM | POA: Diagnosis not present

## 2018-12-21 DIAGNOSIS — M25612 Stiffness of left shoulder, not elsewhere classified: Secondary | ICD-10-CM | POA: Diagnosis not present

## 2018-12-21 DIAGNOSIS — E1165 Type 2 diabetes mellitus with hyperglycemia: Secondary | ICD-10-CM | POA: Diagnosis not present

## 2018-12-21 DIAGNOSIS — E559 Vitamin D deficiency, unspecified: Secondary | ICD-10-CM | POA: Diagnosis not present

## 2018-12-30 DIAGNOSIS — S46012D Strain of muscle(s) and tendon(s) of the rotator cuff of left shoulder, subsequent encounter: Secondary | ICD-10-CM | POA: Diagnosis not present

## 2018-12-30 DIAGNOSIS — M25612 Stiffness of left shoulder, not elsewhere classified: Secondary | ICD-10-CM | POA: Diagnosis not present

## 2019-01-04 DIAGNOSIS — S46012D Strain of muscle(s) and tendon(s) of the rotator cuff of left shoulder, subsequent encounter: Secondary | ICD-10-CM | POA: Diagnosis not present

## 2019-01-04 DIAGNOSIS — M25612 Stiffness of left shoulder, not elsewhere classified: Secondary | ICD-10-CM | POA: Diagnosis not present

## 2019-01-12 DIAGNOSIS — S46012D Strain of muscle(s) and tendon(s) of the rotator cuff of left shoulder, subsequent encounter: Secondary | ICD-10-CM | POA: Diagnosis not present

## 2019-01-12 DIAGNOSIS — M25612 Stiffness of left shoulder, not elsewhere classified: Secondary | ICD-10-CM | POA: Diagnosis not present

## 2019-01-19 DIAGNOSIS — S46012D Strain of muscle(s) and tendon(s) of the rotator cuff of left shoulder, subsequent encounter: Secondary | ICD-10-CM | POA: Diagnosis not present

## 2019-01-19 DIAGNOSIS — M25612 Stiffness of left shoulder, not elsewhere classified: Secondary | ICD-10-CM | POA: Diagnosis not present

## 2019-01-27 DIAGNOSIS — M25612 Stiffness of left shoulder, not elsewhere classified: Secondary | ICD-10-CM | POA: Diagnosis not present

## 2019-01-27 DIAGNOSIS — S46012D Strain of muscle(s) and tendon(s) of the rotator cuff of left shoulder, subsequent encounter: Secondary | ICD-10-CM | POA: Diagnosis not present

## 2019-02-15 DIAGNOSIS — F322 Major depressive disorder, single episode, severe without psychotic features: Secondary | ICD-10-CM | POA: Diagnosis not present

## 2019-03-02 DIAGNOSIS — Z23 Encounter for immunization: Secondary | ICD-10-CM | POA: Diagnosis not present

## 2019-03-22 ENCOUNTER — Other Ambulatory Visit: Payer: Self-pay | Admitting: *Deleted

## 2019-03-22 DIAGNOSIS — Z20822 Contact with and (suspected) exposure to covid-19: Secondary | ICD-10-CM

## 2019-03-24 ENCOUNTER — Telehealth: Payer: Self-pay | Admitting: Family Medicine

## 2019-03-24 LAB — NOVEL CORONAVIRUS, NAA: SARS-CoV-2, NAA: NOT DETECTED

## 2019-03-24 NOTE — Telephone Encounter (Signed)
° °  Pt rec neg COVID results °

## 2019-03-31 DIAGNOSIS — E782 Mixed hyperlipidemia: Secondary | ICD-10-CM | POA: Diagnosis not present

## 2019-03-31 DIAGNOSIS — E1165 Type 2 diabetes mellitus with hyperglycemia: Secondary | ICD-10-CM | POA: Diagnosis not present

## 2019-03-31 DIAGNOSIS — I10 Essential (primary) hypertension: Secondary | ICD-10-CM | POA: Diagnosis not present

## 2019-05-24 DIAGNOSIS — F322 Major depressive disorder, single episode, severe without psychotic features: Secondary | ICD-10-CM | POA: Diagnosis not present

## 2019-05-26 DIAGNOSIS — F322 Major depressive disorder, single episode, severe without psychotic features: Secondary | ICD-10-CM | POA: Diagnosis not present

## 2019-06-10 DIAGNOSIS — F322 Major depressive disorder, single episode, severe without psychotic features: Secondary | ICD-10-CM | POA: Diagnosis not present

## 2019-06-17 DIAGNOSIS — F322 Major depressive disorder, single episode, severe without psychotic features: Secondary | ICD-10-CM | POA: Diagnosis not present

## 2019-06-21 DIAGNOSIS — F322 Major depressive disorder, single episode, severe without psychotic features: Secondary | ICD-10-CM | POA: Diagnosis not present

## 2019-07-02 DIAGNOSIS — E782 Mixed hyperlipidemia: Secondary | ICD-10-CM | POA: Diagnosis not present

## 2019-07-02 DIAGNOSIS — E1159 Type 2 diabetes mellitus with other circulatory complications: Secondary | ICD-10-CM | POA: Diagnosis not present

## 2019-07-02 DIAGNOSIS — I10 Essential (primary) hypertension: Secondary | ICD-10-CM | POA: Diagnosis not present

## 2019-07-09 DIAGNOSIS — E782 Mixed hyperlipidemia: Secondary | ICD-10-CM | POA: Diagnosis not present

## 2019-07-09 DIAGNOSIS — E1165 Type 2 diabetes mellitus with hyperglycemia: Secondary | ICD-10-CM | POA: Diagnosis not present

## 2019-07-09 DIAGNOSIS — E559 Vitamin D deficiency, unspecified: Secondary | ICD-10-CM | POA: Diagnosis not present

## 2019-07-19 DIAGNOSIS — F322 Major depressive disorder, single episode, severe without psychotic features: Secondary | ICD-10-CM | POA: Diagnosis not present

## 2019-07-21 DIAGNOSIS — L661 Lichen planopilaris: Secondary | ICD-10-CM | POA: Diagnosis not present

## 2019-07-21 DIAGNOSIS — L719 Rosacea, unspecified: Secondary | ICD-10-CM | POA: Diagnosis not present

## 2019-08-24 DIAGNOSIS — L814 Other melanin hyperpigmentation: Secondary | ICD-10-CM | POA: Diagnosis not present

## 2019-08-24 DIAGNOSIS — L578 Other skin changes due to chronic exposure to nonionizing radiation: Secondary | ICD-10-CM | POA: Diagnosis not present

## 2019-08-24 DIAGNOSIS — L719 Rosacea, unspecified: Secondary | ICD-10-CM | POA: Diagnosis not present

## 2019-08-24 DIAGNOSIS — D225 Melanocytic nevi of trunk: Secondary | ICD-10-CM | POA: Diagnosis not present

## 2019-09-16 ENCOUNTER — Other Ambulatory Visit: Payer: Self-pay | Admitting: Family Medicine

## 2019-09-16 DIAGNOSIS — Z1231 Encounter for screening mammogram for malignant neoplasm of breast: Secondary | ICD-10-CM

## 2019-09-20 DIAGNOSIS — M67912 Unspecified disorder of synovium and tendon, left shoulder: Secondary | ICD-10-CM | POA: Diagnosis not present

## 2019-09-20 DIAGNOSIS — M67911 Unspecified disorder of synovium and tendon, right shoulder: Secondary | ICD-10-CM | POA: Diagnosis not present

## 2019-09-23 ENCOUNTER — Ambulatory Visit: Payer: BLUE CROSS/BLUE SHIELD

## 2019-09-27 ENCOUNTER — Ambulatory Visit: Payer: BLUE CROSS/BLUE SHIELD | Admitting: Obstetrics & Gynecology

## 2019-09-28 DIAGNOSIS — F322 Major depressive disorder, single episode, severe without psychotic features: Secondary | ICD-10-CM | POA: Diagnosis not present

## 2019-09-29 ENCOUNTER — Ambulatory Visit
Admission: RE | Admit: 2019-09-29 | Discharge: 2019-09-29 | Disposition: A | Discharge status: Home / Self Care | Payer: BC Managed Care – PPO | Source: Ambulatory Visit | Attending: Family Medicine | Admitting: Family Medicine

## 2019-09-29 ENCOUNTER — Other Ambulatory Visit: Payer: Self-pay

## 2019-09-29 DIAGNOSIS — Z1231 Encounter for screening mammogram for malignant neoplasm of breast: Secondary | ICD-10-CM | POA: Diagnosis not present

## 2019-09-30 ENCOUNTER — Encounter: Payer: Self-pay | Admitting: Obstetrics and Gynecology

## 2019-09-30 ENCOUNTER — Ambulatory Visit (INDEPENDENT_AMBULATORY_CARE_PROVIDER_SITE_OTHER): Payer: BC Managed Care – PPO | Admitting: Obstetrics and Gynecology

## 2019-09-30 VITALS — BP 122/60 | HR 76 | Temp 97.2°F | Ht 63.0 in | Wt 186.0 lb

## 2019-09-30 DIAGNOSIS — Z01419 Encounter for gynecological examination (general) (routine) without abnormal findings: Secondary | ICD-10-CM

## 2019-09-30 DIAGNOSIS — Z23 Encounter for immunization: Secondary | ICD-10-CM

## 2019-09-30 DIAGNOSIS — N952 Postmenopausal atrophic vaginitis: Secondary | ICD-10-CM

## 2019-09-30 DIAGNOSIS — N941 Unspecified dyspareunia: Secondary | ICD-10-CM | POA: Diagnosis not present

## 2019-09-30 DIAGNOSIS — N763 Subacute and chronic vulvitis: Secondary | ICD-10-CM

## 2019-09-30 MED ORDER — ESTRADIOL 10 MCG VA TABS: ORAL_TABLET | VAGINAL | 4 refills | Status: DC

## 2019-09-30 NOTE — Progress Notes (Signed)
54 y.o. G66P1011 Married White or Caucasian Not Hispanic or Latino female here for annual exam. She also have Pain with sex. She states that her vaginal area is dry and intermittently itches.  Entry pain with intercourse, not helped with lubrication. No bleeding.     H/O DM, last HgbA1C was 6.4 She is having issues with alopecia, started on medication.   Rare urge incontinence and GSI. Tolerable  Patient's last menstrual period was 06/09/2016 (exact date).          Sexually active: Yes.    The current method of family planning is post menopausal status.    Exercising: Yes.    Bike, tredmil, eliptical and weights Smoker:  no  Health Maintenance: Pap:  06/11/2016 Normal Hpv Neg   History of abnormal Pap:  no MMG:  5/19/20Density B  Bi rads 1 neg, 09/29/19 negative, cat B BMD:   Never  Colonoscopy 2015 Polyps is aware she is due.   TDaP:  2012 Gardasil: NA   reports that she has quit smoking. She has never used smokeless tobacco. 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 Amenorrhea   . Anxiety   . Depression   . Diabetes mellitus without complication ([place])   . Dyspareunia   . Hypertension   . Urinary incontinence     Past Surgical History:  Procedure Laterality Date  . CARPAL TUNNEL RELEASE Right   . CESAREAN SECTION    . TUBAL LIGATION      Current Outpatient Medications  Medication Sig Dispense Refill  . amLODipine (NORVASC) 2.5 MG tablet Take 2.5 mg by mouth daily.    . BD PEN NEEDLE NANO U/F 32G X 4 MM MISC     . buPROPion (WELLBUTRIN XL) 300 MG 24 hr tablet     . CALCIUM PO Take by mouth daily.    . Cholecalciferol (VITAMIN D PO) Take by mouth daily.    . CRESTOR 10 MG tablet daily.    Marland Kitchen doxycycline (VIBRAMYCIN) 100 MG capsule     . escitalopram (LEXAPRO) 20 MG tablet Take 20 mg by mouth daily.    Marland Kitchen MAGNESIUM PO Take by mouth daily.    . metFORMIN (GLUCOPHAGE-XR) 750 MG 24 hr tablet Take 750  mg by mouth. Takes 2    . Multiple Vitamins-Minerals (MULTIVITAMIN PO) Take by mouth daily.    . Omega-3 Fatty Acids (FISH OIL PO) Take by mouth daily.    . ONE TOUCH ULTRA TEST test strip     . telmisartan-hydrochlorothiazide (MICARDIS HCT) 80-25 MG per tablet daily.    Marland Kitchen terconazole (TERAZOL 7) 0.4 % vaginal cream Use 1 applicator full vaginally for 7 nights 45 g 0  . VICTOZA 18 MG/3ML SOPN      No current facility-administered medications for this visit.    Family History  Problem Relation Age of Onset  . Diabetes Mother   . Hypertension Mother   . Stroke Mother   . Heart attack Mother   . Hypertension Brother   . Heart attack Brother     Review of Systems  Genitourinary:       Painful sex  All other systems reviewed and are negative.   Exam:   LMP 06/09/2016 (Exact Date) Comment: spotting  Weight change: @WEIGHTCHANGE @ Height:      Ht Readings from Last 3 Encounters:  06/11/16 5\' 3"  (1.6 m)  01/13/14 5' 3.75" (1.619 m)  12/15/13 5' 3.75" (1.619 m)    General appearance: alert, cooperative and appears stated age Head: Normocephalic, without obvious abnormality, atraumatic Neck: no adenopathy, supple, symmetrical, trachea midline and thyroid normal to inspection and palpation Lungs: clear to auscultation bilaterally Cardiovascular: regular rate and rhythm Breasts: normal appearance, no masses or tenderness Abdomen: soft, non-tender; non distended,  no masses,  no organomegaly Extremities: extremities normal, atraumatic, no cyanosis or edema Skin: Skin color, texture, turgor normal. No rashes or lesions Lymph nodes: Cervical, supraclavicular, and axillary nodes normal. No abnormal inguinal nodes palpated Neurologic: Grossly normal   Pelvic: External genitalia:  no lesions, mild erythema, mild atrophy              Urethra:  normal appearing urethra with no masses, tenderness or lesions              Bartholins and Skenes: normal                 Vagina: atrophic  appearing vagina with normal color a slight increase in white vaginal discharge. Able to insert 2 fingers vaginally without pain.              Cervix: no lesions               Bimanual Exam:  Uterus:  normal size, contour, position, consistency, mobility, non-tender              Adnexa: no mass, fullness, tenderness               Rectovaginal: Confirms               Anus:  normal sphincter tone, no lesions  Gae Dry chaperoned for the exam.  A:  Well Woman with normal exam  Vaginal atrophy, entry dyspareunia  Intermittent vulvitis, mild discharge  P:   No pap this year  Mammogram normal  Colonoscopy due, she will schedule.  Discussed breast self exam  Discussed calcium and vit D intake  Labs with primary  TDAP given

## 2019-09-30 NOTE — Patient Instructions (Signed)
EXERCISE AND DIET:  We recommended that you start or continue a regular exercise program for good health. Regular exercise means any activity that makes your heart beat faster and makes you sweat.  We recommend exercising at least 30 minutes per day at least 3 days a week, preferably 4 or 5.  We also recommend a diet low in fat and sugar.  Inactivity, poor dietary choices and obesity can cause diabetes, heart attack, stroke, and kidney damage, among others.    ALCOHOL AND SMOKING:  Women should limit their alcohol intake to no more than 7 drinks/beers/glasses of wine (combined, not each!) per week. Moderation of alcohol intake to this level decreases your risk of breast cancer and liver damage. And of course, no recreational drugs are part of a healthy lifestyle.  And absolutely no smoking or even second hand smoke. Most people know smoking can cause heart and lung diseases, but did you know it also contributes to weakening of your bones? Aging of your skin?  Yellowing of your teeth and nails?  CALCIUM AND VITAMIN D:  Adequate intake of calcium and Vitamin D are recommended.  The recommendations for exact amounts of these supplements seem to change often, but generally speaking 1,200 mg of calcium (between diet and supplement) and 800 units of Vitamin D per day seems prudent. Certain women may benefit from higher intake of Vitamin D.  If you are among these women, your doctor will have told you during your visit.    PAP SMEARS:  Pap smears, to check for cervical cancer or precancers,  have traditionally been done yearly, although recent scientific advances have shown that most women can have pap smears less often.  However, every woman still should have a physical exam from her gynecologist every year. It will include a breast check, inspection of the vulva and vagina to check for abnormal growths or skin changes, a visual exam of the cervix, and then an exam to evaluate the size and shape of the uterus and  ovaries.  And after 56 years of age, a rectal exam is indicated to check for rectal cancers. We will also provide age appropriate advice regarding health maintenance, like when you should have certain vaccines, screening for sexually transmitted diseases, bone density testing, colonoscopy, mammograms, etc.   MAMMOGRAMS:  All women over 40 years old should have a yearly mammogram. Many facilities now offer a "3D" mammogram, which may cost around $50 extra out of pocket. If possible,  we recommend you accept the option to have the 3D mammogram performed.  It both reduces the number of women who will be called back for extra views which then turn out to be normal, and it is better than the routine mammogram at detecting truly abnormal areas.    COLON CANCER SCREENING: Now recommend starting at age 45. At this time colonoscopy is not covered for routine screening until 50. There are take home tests that can be done between 45-49.   COLONOSCOPY:  Colonoscopy to screen for colon cancer is recommended for all women at age 50.  We know, you hate the idea of the prep.  We agree, BUT, having colon cancer and not knowing it is worse!!  Colon cancer so often starts as a polyp that can be seen and removed at colonscopy, which can quite literally save your life!  And if your first colonoscopy is normal and you have no family history of colon cancer, most women don't have to have it again for   10 years.  Once every ten years, you can do something that may end up saving your life, right?  We will be happy to help you get it scheduled when you are ready.  Be sure to check your insurance coverage so you understand how much it will cost.  It may be covered as a preventative service at no cost, but you should check your particular policy.      Breast Self-Awareness Breast self-awareness means being familiar with how your breasts look and feel. It involves checking your breasts regularly and reporting any changes to your  health care provider. Practicing breast self-awareness is important. A change in your breasts can be a sign of a serious medical problem. Being familiar with how your breasts look and feel allows you to find any problems early, when treatment is more likely to be successful. All women should practice breast self-awareness, including women who have had breast implants. How to do a breast self-exam One way to learn what is normal for your breasts and whether your breasts are changing is to do a breast self-exam. To do a breast self-exam: Look for Changes  1. Remove all the clothing above your waist. 2. Stand in front of a mirror in a room with good lighting. 3. Put your hands on your hips. 4. Push your hands firmly downward. 5. Compare your breasts in the mirror. Look for differences between them (asymmetry), such as: ? Differences in shape. ? Differences in size. ? Puckers, dips, and bumps in one breast and not the other. 6. Look at each breast for changes in your skin, such as: ? Redness. ? Scaly areas. 7. Look for changes in your nipples, such as: ? Discharge. ? Bleeding. ? Dimpling. ? Redness. ? A change in position. Feel for Changes Carefully feel your breasts for lumps and changes. It is best to do this while lying on your back on the floor and again while sitting or standing in the shower or tub with soapy water on your skin. Feel each breast in the following way:  Place the arm on the side of the breast you are examining above your head.  Feel your breast with the other hand.  Start in the nipple area and make  inch (2 cm) overlapping circles to feel your breast. Use the pads of your three middle fingers to do this. Apply light pressure, then medium pressure, then firm pressure. The light pressure will allow you to feel the tissue closest to the skin. The medium pressure will allow you to feel the tissue that is a little deeper. The firm pressure will allow you to feel the tissue  close to the ribs.  Continue the overlapping circles, moving downward over the breast until you feel your ribs below your breast.  Move one finger-width toward the center of the body. Continue to use the  inch (2 cm) overlapping circles to feel your breast as you move slowly up toward your collarbone.  Continue the up and down exam using all three pressures until you reach your armpit.  Write Down What You Find  Write down what is normal for each breast and any changes that you find. Keep a written record with breast changes or normal findings for each breast. By writing this information down, you do not need to depend only on memory for size, tenderness, or location. Write down where you are in your menstrual cycle, if you are still menstruating. If you are having trouble noticing differences   in your breasts, do not get discouraged. With time you will become more familiar with the variations in your breasts and more comfortable with the exam. How often should I examine my breasts? Examine your breasts every month. If you are breastfeeding, the best time to examine your breasts is after a feeding or after using a breast pump. If you menstruate, the best time to examine your breasts is 5-7 days after your period is over. During your period, your breasts are lumpier, and it may be more difficult to notice changes. When should I see my health care provider? See your health care provider if you notice:  A change in shape or size of your breasts or nipples.  A change in the skin of your breast or nipples, such as a reddened or scaly area.  Unusual discharge from your nipples.  A lump or thick area that was not there before.  Pain in your breasts.  Anything that concerns you.  Atrophic Vaginitis  Atrophic vaginitis is a condition in which the tissues that line the vagina become dry and thin. This condition is most common in women who have stopped having regular menstrual periods (are in  menopause). This usually starts when a woman is 79-12 years old. That is the time when a woman's estrogen levels begin to drop (decrease). Estrogen is a female hormone. It helps to keep the tissues of the vagina moist. It stimulates the vagina to produce a clear fluid that lubricates the vagina for sexual intercourse. This fluid also protects the vagina from infection. Lack of estrogen can cause the lining of the vagina to get thinner and dryer. The vagina may also shrink in size. It may become less elastic. Atrophic vaginitis tends to get worse over time as a woman's estrogen level drops. What are the causes? This condition is caused by the normal drop in estrogen that happens around the time of menopause. What increases the risk? Certain conditions or situations may lower a woman's estrogen level, leading to a higher risk for atrophic vaginitis. You are more likely to develop this condition if:  You are taking medicines that block estrogen.  You have had your ovaries removed.  You are being treated for cancer with X-ray (radiation) or medicines (chemotherapy).  You have given birth or are breastfeeding.  You are older than age 20.  You smoke. What are the signs or symptoms? Symptoms of this condition include:  Pain, soreness, or bleeding during sexual intercourse (dyspareunia).  Vaginal burning, irritation, or itching.  Pain or bleeding when a speculum is used in a vaginal exam (pelvic exam).  Having burning pain when passing urine.  Vaginal discharge that is brown or yellow. In some cases, there are no symptoms. How is this diagnosed? This condition is diagnosed by taking a medical history and doing a physical exam. This will include a pelvic exam that checks the vaginal tissues. Though rare, you may also have other tests, including:  A urine test.  A test that checks the acid balance in your vagina (acid balance test). How is this treated? Treatment for this condition  depends on how severe your symptoms are. Treatment may include:  Using an over-the-counter vaginal lubricant before sex.  Using a long-acting vaginal moisturizer.  Using low-dose vaginal estrogen for moderate to severe symptoms that do not respond to other treatments. Options include creams, tablets, and inserts (vaginal rings). Before you use a vaginal estrogen, tell your health care provider if you have a history of: ?  Breast cancer. ? Endometrial cancer. ? Blood clots. If you are not sexually active and your symptoms are very mild, you may not need treatment. Follow these instructions at home: Medicines  Take over-the-counter and prescription medicines only as told by your health care provider. Do not use herbal or alternative medicines unless your health care provider says that you can.  Use over-the-counter creams, lubricants, or moisturizers for dryness only as directed by your health care provider. General instructions  If your atrophic vaginitis is caused by menopause, discuss all of your menopause symptoms and treatment options with your health care provider.  Do not douche.  Do not use products that can make your vagina dry. These include: ? Scented feminine sprays. ? Scented tampons. ? Scented soaps.  Vaginal intercourse can help to improve blood flow and elasticity of vaginal tissue. If it hurts to have sex, try using a lubricant or moisturizer just before having intercourse. Contact a health care provider if:  Your discharge looks different than normal.  Your vagina has an unusual smell.  You have new symptoms.  Your symptoms do not improve with treatment.  Your symptoms get worse. Summary  Atrophic vaginitis is a condition in which the tissues that line the vagina become dry and thin. It is most common in women who have stopped having regular menstrual periods (are in menopause).  Treatment options include using vaginal lubricants and low-dose vaginal  estrogen.  Contact a health care provider if your vagina has an unusual smell, or if your symptoms get worse or do not improve after treatment. This information is not intended to replace advice given to you by your health care provider. Make sure you discuss any questions you have with your health care provider. Document Revised: 04/11/2017 Document Reviewed: 01/23/2017 Elsevier Patient Education  2020 [employer]  

## 2019-09-30 NOTE — Addendum Note (Signed)
Addended by: Yates Decamp on: 09/30/2019 01:11 PM   Modules accepted: Orders

## 2019-10-03 LAB — NUSWAB BV AND CANDIDA, NAA
Candida albicans, NAA: NEGATIVE
Candida glabrata, NAA: NEGATIVE

## 2019-10-25 ENCOUNTER — Ambulatory Visit: Payer: BC Managed Care – PPO | Admitting: Physical Therapy

## 2019-11-02 ENCOUNTER — Encounter: Payer: Self-pay | Admitting: Physical Therapy

## 2019-11-02 ENCOUNTER — Other Ambulatory Visit: Payer: Self-pay

## 2019-11-02 ENCOUNTER — Ambulatory Visit: Payer: BC Managed Care – PPO | Attending: Surgical | Admitting: Physical Therapy

## 2019-11-02 DIAGNOSIS — M25512 Pain in left shoulder: Secondary | ICD-10-CM | POA: Insufficient documentation

## 2019-11-02 DIAGNOSIS — M25511 Pain in right shoulder: Secondary | ICD-10-CM | POA: Insufficient documentation

## 2019-11-02 DIAGNOSIS — G8929 Other chronic pain: Secondary | ICD-10-CM

## 2019-11-02 DIAGNOSIS — R293 Abnormal posture: Secondary | ICD-10-CM | POA: Insufficient documentation

## 2019-11-02 DIAGNOSIS — M6281 Muscle weakness (generalized): Secondary | ICD-10-CM | POA: Diagnosis not present

## 2019-11-02 NOTE — Patient Instructions (Signed)
Access Code: [email or web address] URL: [email or web address] Date: 11/02/2019 Prepared by: [name]  Exercises Seated Cervical Retraction - 1 x daily - 7 x weekly - 2 sets - 10 reps - 3" hold Seated Scapular Retraction - 1 x daily - 7 x weekly - 2 sets - 10 reps - 3" hold Corner Pec Major Stretch - 1 x daily - 7 x weekly - 1 sets - 3 reps - 30" hold

## 2019-11-02 NOTE — Therapy (Signed)
[facility] [place], [place], [address] Phone: [phone]   Fax:  [phone]  Physical Therapy Evaluation  Patient Details  Name: Gabrielle Russell MRN: [id number] Date of Birth: 07/07/1965 Referring Provider (PT): Grier Mitts, [place]   Encounter Date: 11/02/2019   PT End of Session - 11/02/19 1424    Visit Number 1    Number of Visits 12    Date for PT Re-Evaluation 12/21/19    Authorization Type FOTO initial: 37% limitation; Progress note every 10th visit    PT Start Time 0945    PT Stop Time 1032    PT Time Calculation (min) 47 min    Activity Tolerance Patient tolerated treatment well    Behavior During Therapy [facility] for tasks assessed/performed           Past Medical History:  Diagnosis Date  . Amenorrhea   . Anxiety   . Depression   . Diabetes mellitus without complication ([place])   . Dyspareunia   . Hypertension   . Urinary incontinence     Past Surgical History:  Procedure Laterality Date  . CARPAL TUNNEL RELEASE Right   . CESAREAN SECTION    . TUBAL LIGATION      There were no vitals filed for this visit.    Subjective Assessment - 11/02/19 1411    Subjective COVID-19 screening performed upon arrival.Patient arrives to physical therapy with reports of bilateral shoulder pain that began insidiously. Patient reports pain began in left shoulder about one year ago and the right shoulder pain began about 6 months ago. Patient reports more pain when laying on each shoulder at night. Patient reports intermittent bilateral numbness in each pinky fingers. Patient reports increased stiffness that limits her range of motion. Patient able to complete ADLs independently but requires increased time and has most difficulties with overhead reaching and hooking/unhooking bra. Patient reports pain at worst as 5/10 and pain at best as 0/10. Patient's goals are to decrease pain, improve movement, and improve dressing  activities.    Pertinent History HTN, DM    Limitations Lifting;House hold activities    Diagnostic tests x-ray: normal results    Patient Stated Goals improve movement, dress with less pain    Currently in Pain? Yes    Pain Score 4     Pain Location Shoulder    Pain Orientation Right;Left    Pain Descriptors / Indicators Aching;Sore    Pain Type Chronic pain    Pain Radiating Towards 4th and 5th fingers bilaterally    Pain Onset More than a month ago    Pain Frequency Intermittent    Aggravating Factors  lying on my shoulder for long periods    Pain Relieving Factors getting up or moving positions    Effect of Pain on Daily Activities "not really during the day"              [facility] PT Assessment - 11/02/19 0001      Assessment   Medical Diagnosis right and left shoulder rotator cuff tendonopathy/impingement    Referring Provider (PT) Grier Mitts, PA-C    Onset Date/Surgical Date --   left shoulder one year, right shoulder 6 months   Hand Dominance Right    Prior Therapy for left about 1 year ago       Precautions   Precautions None      Restrictions   Weight Bearing Restrictions No      Balance Screen  Has the patient fallen in the past 6 months No    Has the patient had a decrease in activity level because of a fear of falling?  No    Is the patient reluctant to leave their home because of a fear of falling?  No      Prior Function   Level of Independence Independent with basic ADLs      Observation/Other Assessments   Focus on Therapeutic Outcomes (FOTO)  37% limitation      Posture/Postural Control   Posture/Postural Control Postural limitations    Postural Limitations Rounded Shoulders;Forward head      ROM / Strength   AROM / PROM / Strength AROM;Strength;PROM      AROM   Overall AROM  Within functional limits for tasks performed    AROM Assessment Site Shoulder    Right/Left Shoulder Left;Right    Right Shoulder ABduction --   (+) pain AC  joint   Right Shoulder Internal Rotation --   T12 bilaterally   Right Shoulder External Rotation --   T4 bilaterally     PROM   PROM Assessment Site Shoulder    Right/Left Shoulder Right;Left    Right Shoulder Flexion 173 Degrees    Right Shoulder ABduction 165 Degrees    Right Shoulder Internal Rotation 45 Degrees    Right Shoulder External Rotation 95 Degrees    Left Shoulder Flexion 170 Degrees    Left Shoulder ABduction 160 Degrees    Left Shoulder Internal Rotation 48 Degrees    Left Shoulder External Rotation 72 Degrees      Strength   Strength Assessment Site Shoulder    Right/Left Shoulder Right;Left    Right Shoulder Flexion 3+/5    Right Shoulder ABduction 3+/5    Right Shoulder Internal Rotation 3+/5    Right Shoulder External Rotation 3+/5    Left Shoulder Flexion 3+/5    Left Shoulder ABduction 4-/5    Left Shoulder Internal Rotation 3+/5    Left Shoulder External Rotation 3+/5      Palpation   Palpation comment slight tenderness to palpation to [facility] joint region but reports pain is more deep      Special Tests    Special Tests Rotator Cuff Impingement    Rotator Cuff Impingment tests Hawkins- Kennedy test;Painful Arc of Motion      Hawkins-Kennedy test   Findings Negative    Side Right   and left                     Objective measurements completed on examination: See above findings.               PT Education - 11/02/19 1420    Education Details chin tucks, scapular retraction, corner stretch    Person(s) Educated Patient    Methods Explanation;Demonstration;Handout    Comprehension Verbalized understanding;Returned demonstration               PT Long Term Goals - 11/02/19 1442      PT LONG TERM GOAL #1   Title Patient will be independent with HEP    Time 6    Period Weeks    Status New      PT LONG TERM GOAL #2   Title Patient will report ability to perform ADLs witih bilateral shoulder pain less than or equal to  2/10.    Time 6    Period Weeks    Status New  PT LONG TERM GOAL #3   Title Patient will report ability to don/doff bra with bilateral shoulder pain less than or equal to 2/10.    Time 6    Period Weeks    Status New      PT LONG TERM GOAL #4   Title Patient will demonstrate 4/5 or greater bilateral shoulder MMT to improve stability during functional tasks.    Time 6    Period Weeks    Status New                  Plan - 11/02/19 1440    Clinical Impression Statement Patent is a 54 year old right handed female who presents to physical therapy with bilateral shoulder pain and decreased bilateral shoulder MMT that began insidiously one year to 6 months ago. Patient noted with WNL AROM upon assessment but with pain at end range. Patient (-) for Hawkin's Kennedy Test for impingement for bilaterally. (+) Painful Arc Test at 120+ degrees bilaterally indicating possible AC joint involvement. Patient noted with forward head and rounded shoulders in sitting. Patient and PT discussed plan of care and HEP to which patient reported understanding. Patient would benefit from skilled physical therapy to address deficits and address patient's goals.    Personal Factors and Comorbidities Age;Comorbidity 2    Comorbidities HTN, DM    Examination-Activity Limitations Carry;Reach Overhead    Stability/Clinical Decision Making Stable/Uncomplicated    Clinical Decision Making Low    Rehab Potential Good    PT Frequency 2x / week    PT Duration 6 weeks    PT Treatment/Interventions ADLs/Self Care Home Management;Cryotherapy;Electrical Stimulation;Iontophoresis 4mg /ml Dexamethasone;Moist Heat;Ultrasound;Neuromuscular re-education;Balance training;Therapeutic exercise;Therapeutic activities;Functional mobility training;Patient/family education;Vasopneumatic Device;Taping;Dry needling;Passive range of motion;Manual techniques    PT Next Visit Plan UBE, postural exercises, gentle shoulder exercises and  stretching, modalities PRN for pain relief.    PT Home Exercise Plan see patient education section    Consulted and Agree with Plan of Care Patient           Patient will benefit from skilled therapeutic intervention in order to improve the following deficits and impairments:  Decreased activity tolerance, Decreased strength, Decreased range of motion, Pain, Postural dysfunction, Impaired UE functional use  Visit Diagnosis: Chronic right shoulder pain - Plan: PT plan of care cert/re-cert  Chronic left shoulder pain - Plan: PT plan of care cert/re-cert  Muscle weakness (generalized) - Plan: PT plan of care cert/re-cert  Abnormal posture - Plan: PT plan of care cert/re-cert     Problem List Patient Active Problem List   Diagnosis Date Noted  . Type II or unspecified type diabetes mellitus without mention of complication, not stated as uncontrolled 12/15/2013    Class: History of  . Accelerated hypertension 12/15/2013  . Elevated cholesterol 12/15/2013    Class: History of    [name], PT, DPT 11/02/2019, 8:03 PM  [facility] [address] [place], [place], [address] Phone: [phone]   Fax:  [phone]  Name: ARIYAN SINNETT MRN: [id number] Date of Birth: October 30, 1965

## 2019-11-04 ENCOUNTER — Ambulatory Visit: Payer: BC Managed Care – PPO | Admitting: *Deleted

## 2019-11-09 ENCOUNTER — Other Ambulatory Visit: Payer: Self-pay

## 2019-11-09 ENCOUNTER — Ambulatory Visit: Payer: BC Managed Care – PPO | Admitting: Physical Therapy

## 2019-11-09 DIAGNOSIS — R293 Abnormal posture: Secondary | ICD-10-CM | POA: Diagnosis not present

## 2019-11-09 DIAGNOSIS — M25512 Pain in left shoulder: Secondary | ICD-10-CM | POA: Diagnosis not present

## 2019-11-09 DIAGNOSIS — G8929 Other chronic pain: Secondary | ICD-10-CM | POA: Diagnosis not present

## 2019-11-09 DIAGNOSIS — M6281 Muscle weakness (generalized): Secondary | ICD-10-CM

## 2019-11-09 DIAGNOSIS — M25511 Pain in right shoulder: Secondary | ICD-10-CM | POA: Diagnosis not present

## 2019-11-09 NOTE — Therapy (Signed)
[facility] [place], [place], [address] Phone: [phone]   Fax:  [phone]  Physical Therapy Treatment  Patient Details  Name: Gabrielle Russell MRN: [id number] Date of Birth: Jul 26, 1965 Referring Provider (PT): Grier Mitts, [place]   Encounter Date: 11/09/2019   PT End of Session - 11/09/19 1030    Visit Number 2    Number of Visits 12    Date for PT Re-Evaluation 12/21/19    Authorization Type FOTO initial: 37% limitation; Progress note every 10th visit    PT Start Time 0945    PT Stop Time 1029    PT Time Calculation (min) 44 min    Activity Tolerance Patient tolerated treatment well    Behavior During Therapy [facility] for tasks assessed/performed           Past Medical History:  Diagnosis Date  . Amenorrhea   . Anxiety   . Depression   . Diabetes mellitus without complication ([place])   . Dyspareunia   . Hypertension   . Urinary incontinence     Past Surgical History:  Procedure Laterality Date  . CARPAL TUNNEL RELEASE Right   . CESAREAN SECTION    . TUBAL LIGATION      There were no vitals filed for this visit.   Subjective Assessment - 11/09/19 1232    Subjective COVID-19 screening performed upon arrival. Patient reported 4/10 pain while sleeping last night, its calmed down since.    Pertinent History HTN, DM    Limitations Lifting;House hold activities    Diagnostic tests x-ray: normal results    Patient Stated Goals improve movement, dress with less pain    Currently in Pain? Yes    Pain Location Shoulder    Pain Orientation Left;Right    Pain Descriptors / Indicators Discomfort    Pain Type Chronic pain              [facility] PT Assessment - 11/09/19 0001      Assessment   Medical Diagnosis right and left shoulder rotator cuff tendonopathy/impingement    Referring Provider (PT) Grier Mitts, PA-C    Hand Dominance Right                         [facility] Adult PT  Treatment/Exercise - 11/09/19 0001      Exercises   Exercises Shoulder      Shoulder Exercises: Standing   Extension Strengthening;Both;20 reps    Extension Limitations Green XTS    Row Strengthening;Both;20 reps    Row Limitations Green XTS      Shoulder Exercises: ROM/Strengthening   UBE (Upper Arm Bike) 120 RPM 8 mins (4 fwd, 4bwd)    "W" Arms 2x10    X to V Arms 2x10      Modalities   Modalities Ultrasound      Ultrasound   Ultrasound Location bilateral shoulders; AC joint and posterior cuff region    Ultrasound Parameters 100% 3.3 mhz, 1.5 w/cm2 x10 mins    Ultrasound Goals Pain                  PT Education - 11/09/19 1030    Education Details X to V, horizontal abduction no band, W arms    Person(s) Educated Patient    Methods Explanation;Demonstration;Handout    Comprehension Verbalized understanding;Returned demonstration               PT Long Term Goals - 11/02/19  [place] #1   Title Patient will be independent with HEP    Time 6    Period Weeks    Status New      PT LONG TERM GOAL #2   Title Patient will report ability to perform ADLs witih bilateral shoulder pain less than or equal to 2/10.    Time 6    Period Weeks    Status New      PT LONG TERM GOAL #3   Title Patient will report ability to don/doff bra with bilateral shoulder pain less than or equal to 2/10.    Time 6    Period Weeks    Status New      PT LONG TERM GOAL #4   Title Patient will demonstrate 4/5 or greater bilateral shoulder MMT to improve stability during functional tasks.    Time 6    Period Weeks    Status New                 Plan - 11/09/19 1151    Clinical Impression Statement Patient responded well to therapy session with slight increase of pain especially at end range in [facility] joint bilaterally. Patient provided with verbal and tactile cuing for proper form and technique to which patient able to complete with improved form for  remaining exercises. Patient instructed on importance of performing HEP. Patient provided with new exercises as well. No adverse affects upon removal of modalities.    Personal Factors and Comorbidities Age;Comorbidity 2    Comorbidities HTN, DM    Examination-Activity Limitations Carry;Reach Overhead    Stability/Clinical Decision Making Stable/Uncomplicated    Clinical Decision Making Low    Rehab Potential Good    PT Frequency 2x / week    PT Duration 6 weeks    PT Treatment/Interventions ADLs/Self Care Home Management;Cryotherapy;Electrical Stimulation;Iontophoresis 4mg /ml Dexamethasone;Moist Heat;Ultrasound;Neuromuscular re-education;Balance training;Therapeutic exercise;Therapeutic activities;Functional mobility training;Patient/family education;Vasopneumatic Device;Taping;Dry needling;Passive range of motion;Manual techniques    PT Next Visit Plan UBE, postural exercises, gentle shoulder exercises and stretching, modalities PRN for pain relief.    PT Home Exercise Plan see patient education section    Consulted and Agree with Plan of Care Patient           Patient will benefit from skilled therapeutic intervention in order to improve the following deficits and impairments:  Decreased activity tolerance, Decreased strength, Decreased range of motion, Pain, Postural dysfunction, Impaired UE functional use  Visit Diagnosis: Chronic right shoulder pain  Chronic left shoulder pain  Muscle weakness (generalized)  Abnormal posture     Problem List Patient Active Problem List   Diagnosis Date Noted  . Type II or unspecified type diabetes mellitus without mention of complication, not stated as uncontrolled 12/15/2013    Class: History of  . Accelerated hypertension 12/15/2013  . Elevated cholesterol 12/15/2013    Class: History of    Gabrielle Russell, PT, DPT 11/09/2019, 12:34 PM  [facility] [address] [place], [place],  [address] Phone: [phone]   Fax:  [phone]  Name: Gabrielle Russell MRN: [id number] Date of Birth: Aug 09, 1965

## 2019-11-12 ENCOUNTER — Other Ambulatory Visit: Payer: Self-pay

## 2019-11-12 ENCOUNTER — Ambulatory Visit: Payer: BC Managed Care – PPO | Attending: Surgical | Admitting: Physical Therapy

## 2019-11-12 ENCOUNTER — Encounter: Payer: Self-pay | Admitting: Physical Therapy

## 2019-11-12 DIAGNOSIS — G8929 Other chronic pain: Secondary | ICD-10-CM | POA: Diagnosis not present

## 2019-11-12 DIAGNOSIS — M6281 Muscle weakness (generalized): Secondary | ICD-10-CM | POA: Insufficient documentation

## 2019-11-12 DIAGNOSIS — R293 Abnormal posture: Secondary | ICD-10-CM | POA: Insufficient documentation

## 2019-11-12 DIAGNOSIS — M25512 Pain in left shoulder: Secondary | ICD-10-CM

## 2019-11-12 DIAGNOSIS — M25511 Pain in right shoulder: Secondary | ICD-10-CM | POA: Diagnosis not present

## 2019-11-12 NOTE — Therapy (Signed)
[facility] [place], [place], [address] Phone: [phone]   Fax:  [phone]  Physical Therapy Treatment  Patient Details  Name: Gabrielle Russell MRN: [id number] Date of Birth: Dec 13, 1965 Referring Provider (PT): Grier Mitts, [place]   Encounter Date: 11/12/2019   PT End of Session - 11/12/19 1247    Visit Number 3    Number of Visits 12    Date for PT Re-Evaluation 12/21/19    Authorization Type FOTO initial: 37% limitation; Progress note every 10th visit    PT Start Time 0945    PT Stop Time 1030    PT Time Calculation (min) 45 min    Activity Tolerance Patient tolerated treatment well    Behavior During Therapy [facility] for tasks assessed/performed           Past Medical History:  Diagnosis Date  . Amenorrhea   . Anxiety   . Depression   . Diabetes mellitus without complication ([place])   . Dyspareunia   . Hypertension   . Urinary incontinence     Past Surgical History:  Procedure Laterality Date  . CARPAL TUNNEL RELEASE Right   . CESAREAN SECTION    . TUBAL LIGATION      There were no vitals filed for this visit.   Subjective Assessment - 11/12/19 1247    Subjective COVID-19 screening performed upon arrival. Patient reports doing well today, still with ongoing pain at night.    Pertinent History HTN, DM    Limitations Lifting;House hold activities    Diagnostic tests x-ray: normal results    Patient Stated Goals improve movement, dress with less pain    Currently in Pain? No/denies              [facility] PT Assessment - 11/12/19 0001      Assessment   Medical Diagnosis right and left shoulder rotator cuff tendonopathy/impingement    Referring Provider (PT) Grier Mitts, PA-C    Hand Dominance Right                         [facility] Adult PT Treatment/Exercise - 11/12/19 0001      Exercises   Exercises Shoulder      Shoulder Exercises: Standing   Protraction  Strengthening;Both;20 reps;Theraband    Theraband Level (Shoulder Protraction) Level 1 (Yellow)    Horizontal ABduction Strengthening;Both;20 reps;Theraband    Theraband Level (Shoulder Horizontal ABduction) Level 1 (Yellow)    External Rotation Strengthening;Both;20 reps;Theraband    Theraband Level (Shoulder External Rotation) Level 1 (Yellow)    Internal Rotation Strengthening;Both;20 reps;Theraband    Theraband Level (Shoulder Internal Rotation) Level 1 (Yellow)    Extension Strengthening;Both;20 reps    Extension Limitations Blue XTS    Row Strengthening;Both;20 reps    Row Limitations Blue XTS    Diagonals Strengthening;Both;20 reps    Diagonals Limitations Blue XTS chop/lift      Shoulder Exercises: ROM/Strengthening   UBE (Upper Arm Bike) 120 RPM 8 mins (4 fwd, 4bwd)      Modalities   Modalities Ultrasound      Ultrasound   Ultrasound Location bilateral shoulders    Ultrasound Parameters combo 100% 1 mhz 1.5 w/cm2 x10 mins    Ultrasound Goals Pain                       PT Long Term Goals - 11/02/19 1442      PT LONG TERM  GOAL #1   Title Patient will be independent with HEP    Time 6    Period Weeks    Status New      PT LONG TERM GOAL #2   Title Patient will report ability to perform ADLs witih bilateral shoulder pain less than or equal to 2/10.    Time 6    Period Weeks    Status New      PT LONG TERM GOAL #3   Title Patient will report ability to don/doff bra with bilateral shoulder pain less than or equal to 2/10.    Time 6    Period Weeks    Status New      PT LONG TERM GOAL #4   Title Patient will demonstrate 4/5 or greater bilateral shoulder MMT to improve stability during functional tasks.    Time 6    Period Weeks    Status New                 Plan - 11/12/19 1248    Clinical Impression Statement Patient responded well to therapy session with no reports of increased pain while performing exercises. Patient guided through TEs  with good form and technique. Combo US/E-stim initated today with good response and prefers combo over ultrasound alone.    Personal Factors and Comorbidities Age;Comorbidity 2    Comorbidities HTN, DM    Examination-Activity Limitations Carry;Reach Overhead    Stability/Clinical Decision Making Stable/Uncomplicated    Clinical Decision Making Low    Rehab Potential Good    PT Frequency 2x / week    PT Duration 6 weeks    PT Treatment/Interventions ADLs/Self Care Home Management;Cryotherapy;Electrical Stimulation;Iontophoresis 4mg /ml Dexamethasone;Moist Heat;Ultrasound;Neuromuscular re-education;Balance training;Therapeutic exercise;Therapeutic activities;Functional mobility training;Patient/family education;Vasopneumatic Device;Taping;Dry needling;Passive range of motion;Manual techniques    PT Next Visit Plan UBE, postural exercises, gentle shoulder exercises and stretching, modalities PRN for pain relief.    PT Home Exercise Plan see patient education section    Consulted and Agree with Plan of Care Patient           Patient will benefit from skilled therapeutic intervention in order to improve the following deficits and impairments:  Decreased activity tolerance, Decreased strength, Decreased range of motion, Pain, Postural dysfunction, Impaired UE functional use  Visit Diagnosis: Chronic right shoulder pain  Chronic left shoulder pain  Muscle weakness (generalized)  Abnormal posture     Problem List Patient Active Problem List   Diagnosis Date Noted  . Type II or unspecified type diabetes mellitus without mention of complication, not stated as uncontrolled 12/15/2013    Class: History of  . Accelerated hypertension 12/15/2013  . Elevated cholesterol 12/15/2013    Class: History of    Gabrielle Russell, PT, DPT 11/12/2019, 12:57 PM  [facility] [address] [place], [place], [address] Phone: [phone]   Fax:   [phone]  Name: Gabrielle Russell MRN: [id number] Date of Birth: 28-Dec-1965

## 2019-11-18 ENCOUNTER — Ambulatory Visit: Payer: BC Managed Care – PPO | Admitting: Physical Therapy

## 2019-11-18 ENCOUNTER — Other Ambulatory Visit: Payer: Self-pay

## 2019-11-18 DIAGNOSIS — M25512 Pain in left shoulder: Secondary | ICD-10-CM | POA: Diagnosis not present

## 2019-11-18 DIAGNOSIS — M25511 Pain in right shoulder: Secondary | ICD-10-CM | POA: Diagnosis not present

## 2019-11-18 DIAGNOSIS — M6281 Muscle weakness (generalized): Secondary | ICD-10-CM

## 2019-11-18 DIAGNOSIS — R293 Abnormal posture: Secondary | ICD-10-CM | POA: Diagnosis not present

## 2019-11-18 DIAGNOSIS — G8929 Other chronic pain: Secondary | ICD-10-CM | POA: Diagnosis not present

## 2019-11-18 NOTE — Therapy (Signed)
[facility] [place], [place], [address] Phone: [phone]   Fax:  [phone]  Physical Therapy Treatment  Patient Details  Name: Gabrielle Russell MRN: [id number] Date of Birth: 07/04/1965 Referring Provider (PT): Grier Mitts, [place]   Encounter Date: 11/18/2019   PT End of Session - 11/18/19 1204    Visit Number 4    Number of Visits 12    Date for PT Re-Evaluation 12/21/19    Authorization Type FOTO initial: 37% limitation; Progress note every 10th visit    PT Start Time 1200    PT Stop Time 1240    PT Time Calculation (min) 40 min    Activity Tolerance Patient tolerated treatment well    Behavior During Therapy [facility] for tasks assessed/performed           Past Medical History:  Diagnosis Date  . Amenorrhea   . Anxiety   . Depression   . Diabetes mellitus without complication ([place])   . Dyspareunia   . Hypertension   . Urinary incontinence     Past Surgical History:  Procedure Laterality Date  . CARPAL TUNNEL RELEASE Right   . CESAREAN SECTION    . TUBAL LIGATION      There were no vitals filed for this visit.   Subjective Assessment - 11/18/19 1203    Subjective COVID-19 screening performed upon arrival. Patient reporting, "it's just a litle stiff".    Pertinent History HTN, DM    Limitations Lifting;House hold activities    Diagnostic tests x-ray: normal results    Patient Stated Goals improve movement, dress with less pain    Currently in Pain? Yes    Pain Score 1     Pain Location Shoulder    Pain Orientation Right;Left    Pain Descriptors / Indicators --   stiffness   Pain Type Chronic pain    Pain Onset More than a month ago                             [facility] Adult PT Treatment/Exercise - 11/18/19 0001      Exercises   Exercises Shoulder      Shoulder Exercises: Standing   Protraction Strengthening;Both;20 reps;Theraband    Theraband Level (Shoulder Protraction)  Level 2 (Red)    Horizontal ABduction Strengthening;Both;20 reps;Theraband    Theraband Level (Shoulder Horizontal ABduction) Level 2 (Red)    External Rotation Strengthening;Both;20 reps;Theraband    Theraband Level (Shoulder External Rotation) Level 2 (Red)    Internal Rotation Strengthening;Both;20 reps;Theraband    Theraband Level (Shoulder Internal Rotation) Level 2 (Red)    Extension Strengthening;Both;20 reps    Extension Limitations Blue XTS    Row Strengthening;Both;20 reps    Row Limitations Blue XTS    Diagonals Strengthening;Both;20 reps    Diagonals Limitations Blue XTS chop/lift      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    Status On-going      PT LONG TERM GOAL #3   Title Patient will report ability to don/doff bra with bilateral shoulder pain less than or equal to 2/10.    Status On-going      PT LONG TERM GOAL #4   Title Patient will demonstrate 4/5 or greater bilateral shoulder MMT to improve stability during functional tasks.    Status On-going                 Plan - 11/18/19 1213    Clinical Impression Statement Pt tolerating exercises well today progressing with strengtheing and ROM. Pt reporting mild soreness/aggrivation since she began doing more with her shoulders since beginning therapy. Pt was educated on movement and muscle response.    Personal Factors and Comorbidities Age;Comorbidity 2    Comorbidities  HTN, DM    Examination-Activity Limitations Carry;Reach Overhead    Stability/Clinical Decision Making Stable/Uncomplicated    Rehab Potential Good    PT Frequency 2x / week    PT Duration 6 weeks    PT Treatment/Interventions ADLs/Self Care Home Management;Cryotherapy;Electrical Stimulation;Iontophoresis 4mg /ml Dexamethasone;Moist Heat;Ultrasound;Neuromuscular re-education;Balance training;Therapeutic exercise;Therapeutic activities;Functional mobility training;Patient/family education;Vasopneumatic Device;Taping;Dry needling;Passive range of motion;Manual techniques    PT Next Visit Plan UBE, postural exercises, gentle shoulder exercises and stretching, modalities PRN for pain relief.    Consulted and Agree with Plan of Care Patient           Patient will benefit from skilled therapeutic intervention in order to improve the following deficits and impairments:  Decreased activity tolerance, Decreased strength, Decreased range of motion, Pain, Postural dysfunction, Impaired UE functional use  Visit Diagnosis: Chronic right shoulder pain  Chronic left shoulder pain  Muscle weakness (generalized)  Abnormal posture     Problem List Patient Active Problem List   Diagnosis Date Noted  . Type II or unspecified type diabetes mellitus without mention of complication, not stated as uncontrolled 12/15/2013    Class: History of  . Accelerated hypertension 12/15/2013  . Elevated cholesterol 12/15/2013    Class: History of    Oretha Caprice, PT, MPT 11/18/2019, 12:19 PM  [facility] [address] [place], [place], [address] Phone: [phone]   Fax:  [phone]  Name: Gabrielle Russell MRN: [id number] Date of Birth: Feb 21, 1966

## 2019-11-22 ENCOUNTER — Encounter: Payer: Self-pay | Admitting: Physical Therapy

## 2019-11-22 ENCOUNTER — Other Ambulatory Visit: Payer: Self-pay

## 2019-11-22 ENCOUNTER — Ambulatory Visit: Payer: BC Managed Care – PPO | Admitting: Physical Therapy

## 2019-11-22 DIAGNOSIS — R293 Abnormal posture: Secondary | ICD-10-CM

## 2019-11-22 DIAGNOSIS — M25512 Pain in left shoulder: Secondary | ICD-10-CM

## 2019-11-22 DIAGNOSIS — M6281 Muscle weakness (generalized): Secondary | ICD-10-CM

## 2019-11-22 DIAGNOSIS — M25511 Pain in right shoulder: Secondary | ICD-10-CM | POA: Diagnosis not present

## 2019-11-22 DIAGNOSIS — G8929 Other chronic pain: Secondary | ICD-10-CM | POA: Diagnosis not present

## 2019-11-22 NOTE — Therapy (Signed)
[facility] [place], [place], [address] Phone: [phone]   Fax:  [phone]  Physical Therapy Treatment  Patient Details  Name: Gabrielle Russell MRN: [id number] Date of Birth: July 22, 1965 Referring Provider (PT): Grier Mitts, [place]   Encounter Date: 11/22/2019   PT End of Session - 11/22/19 1427    Visit Number 5    Number of Visits 12    Date for PT Re-Evaluation 12/21/19    Authorization Type FOTO initial: 37% limitation; Progress note every 10th visit    PT Start Time 1431    PT Stop Time 1515    PT Time Calculation (min) 44 min    Activity Tolerance Patient tolerated treatment well    Behavior During Therapy [facility] for tasks assessed/performed           Past Medical History:  Diagnosis Date  . Amenorrhea   . Anxiety   . Depression   . Diabetes mellitus without complication ([place])   . Dyspareunia   . Hypertension   . Urinary incontinence     Past Surgical History:  Procedure Laterality Date  . CARPAL TUNNEL RELEASE Right   . CESAREAN SECTION    . TUBAL LIGATION      There were no vitals filed for this visit.   Subjective Assessment - 11/22/19 1427    Subjective COVID-19 screening performed upon arrival. Patient reporting, "it's just a litle stiff".    Pertinent History HTN, DM    Limitations Lifting;House hold activities    Diagnostic tests x-ray: normal results    Patient Stated Goals improve movement, dress with less pain    Currently in Pain? No/denies              [facility] PT Assessment - 11/22/19 0001      Assessment   Medical Diagnosis right and left shoulder rotator cuff tendonopathy/impingement    Referring Provider (PT) Grier Mitts, PA-C    Hand Dominance Right    Next MD Visit After PT                         [facility] Adult PT Treatment/Exercise - 11/22/19 0001      Shoulder Exercises: Standing   Protraction Strengthening;Both;20 reps;Theraband     Theraband Level (Shoulder Protraction) Level 2 (Red)    Horizontal ABduction Strengthening;Both;20 reps;Theraband    Theraband Level (Shoulder Horizontal ABduction) Level 2 (Red)    External Rotation Strengthening;Both;20 reps;Theraband    Theraband Level (Shoulder External Rotation) Level 2 (Red)    Internal Rotation Strengthening;Both;20 reps;Theraband    Theraband Level (Shoulder Internal Rotation) Level 2 (Red)    Extension Strengthening;Both;20 reps    Extension Limitations Blue XTS    Row Strengthening;Both;20 reps    Row Limitations Blue XTS    Other Standing Exercises AAROM ext, IR x15 reps each      Shoulder Exercises: Pulleys   Flexion 3 minutes   holds at end rnage     Shoulder Exercises: ROM/Strengthening   UBE (Upper Arm Bike) 120 RPM 8 mins (4 fwd, 4bwd)      Shoulder Exercises: Stretch   Corner Stretch 3 reps;30 seconds      Modalities   Modalities Ultrasound      Ultrasound   Ultrasound Location L proximal shoulder    Ultrasound Parameters Combo 1.5 w/cm2, 100%, 1 mhz x10 min    Ultrasound Goals Pain  PT Long Term Goals - 11/18/19 1205      PT LONG TERM GOAL #1   Title Patient will be independent with HEP    Time 6    Period Weeks    Status On-going      PT LONG TERM GOAL #2   Title Patient will report ability to perform ADLs witih bilateral shoulder pain less than or equal to 2/10.    Status On-going      PT LONG TERM GOAL #3   Title Patient will report ability to don/doff bra with bilateral shoulder pain less than or equal to 2/10.    Status On-going      PT LONG TERM GOAL #4   Title Patient will demonstrate 4/5 or greater bilateral shoulder MMT to improve stability during functional tasks.    Status On-going                 Plan - 11/22/19 1518    Clinical Impression Statement Patient presented in clinic with reports of end range tightness into flexion with bilateral shoulders and some stiffness.  Patient guided through more strengthening exercises although with capsular stretching. No complaints of pain during treatment although minimal weakness and ROM noted with L shoulder more. Normal [place] response noted during session and encouraged to continue HEP as directed.    Personal Factors and Comorbidities Age;Comorbidity 2    Comorbidities HTN, DM    Examination-Activity Limitations Carry;Reach Overhead    Stability/Clinical Decision Making Stable/Uncomplicated    Rehab Potential Good    PT Frequency 2x / week    PT Duration 6 weeks    PT Treatment/Interventions ADLs/Self Care Home Management;Cryotherapy;Electrical Stimulation;Iontophoresis 4mg /ml Dexamethasone;Moist Heat;Ultrasound;Neuromuscular re-education;Balance training;Therapeutic exercise;Therapeutic activities;Functional mobility training;Patient/family education;Vasopneumatic Device;Taping;Dry needling;Passive range of motion;Manual techniques    PT Next Visit Plan UBE, postural exercises, gentle shoulder exercises and stretching, modalities PRN for pain relief.    PT Home Exercise Plan see patient education section    Consulted and Agree with Plan of Care Patient           Patient will benefit from skilled therapeutic intervention in order to improve the following deficits and impairments:  Decreased activity tolerance, Decreased strength, Decreased range of motion, Pain, Postural dysfunction, Impaired UE functional use  Visit Diagnosis: Chronic right shoulder pain  Chronic left shoulder pain  Muscle weakness (generalized)  Abnormal posture     Problem List Patient Active Problem List   Diagnosis Date Noted  . Type II or unspecified type diabetes mellitus without mention of complication, not stated as uncontrolled 12/15/2013    Class: History of  . Accelerated hypertension 12/15/2013  . Elevated cholesterol 12/15/2013    Class: History of    Standley Brooking, PTA 11/22/2019, 3:30 PM  [facility] [address] [place], [place], [address] Phone: [phone]   Fax:  [phone]  Name: Gabrielle Russell MRN: [id number] Date of Birth: [birth year]

## 2019-11-23 DIAGNOSIS — L661 Lichen planopilaris: Secondary | ICD-10-CM | POA: Diagnosis not present

## 2019-11-23 DIAGNOSIS — L219 Seborrheic dermatitis, unspecified: Secondary | ICD-10-CM | POA: Diagnosis not present

## 2019-11-25 ENCOUNTER — Encounter: Payer: Self-pay | Admitting: Physical Therapy

## 2019-11-25 ENCOUNTER — Ambulatory Visit: Payer: BC Managed Care – PPO | Admitting: Physical Therapy

## 2019-11-25 ENCOUNTER — Other Ambulatory Visit: Payer: Self-pay

## 2019-11-25 DIAGNOSIS — G8929 Other chronic pain: Secondary | ICD-10-CM

## 2019-11-25 DIAGNOSIS — R293 Abnormal posture: Secondary | ICD-10-CM | POA: Diagnosis not present

## 2019-11-25 DIAGNOSIS — M25511 Pain in right shoulder: Secondary | ICD-10-CM

## 2019-11-25 DIAGNOSIS — M25512 Pain in left shoulder: Secondary | ICD-10-CM | POA: Diagnosis not present

## 2019-11-25 DIAGNOSIS — M6281 Muscle weakness (generalized): Secondary | ICD-10-CM

## 2019-11-25 NOTE — Therapy (Signed)
[facility] [place], [place], [address] Phone: [phone]   Fax:  [phone]  Physical Therapy Treatment  Patient Details  Name: Gabrielle Russell MRN: [id number] Date of Birth: Apr 06, 1966 Referring Provider (PT): Grier Mitts, [place]   Encounter Date: 11/25/2019   PT End of Session - 11/25/19 1442    Visit Number 6    Number of Visits 12    Date for PT Re-Evaluation 12/21/19    Authorization Type FOTO initial: 37% limitation; Progress note every 10th visit    PT Start Time 1030    PT Stop Time 1116    PT Time Calculation (min) 46 min    Activity Tolerance Patient tolerated treatment well    Behavior During Therapy [facility] for tasks assessed/performed           Past Medical History:  Diagnosis Date  . Amenorrhea   . Anxiety   . Depression   . Diabetes mellitus without complication ([place])   . Dyspareunia   . Hypertension   . Urinary incontinence     Past Surgical History:  Procedure Laterality Date  . CARPAL TUNNEL RELEASE Right   . CESAREAN SECTION    . TUBAL LIGATION      There were no vitals filed for this visit.   Subjective Assessment - 11/25/19 1259    Subjective COVID-19 screening performed upon arrival. Patient reporting more soreness today, 4/10 due to an unknown cause.    Pertinent History HTN, DM    Limitations Lifting;House hold activities    Diagnostic tests x-ray: normal results    Patient Stated Goals improve movement, dress with less pain    Currently in Pain? Yes    Pain Score 4     Pain Location Shoulder    Pain Orientation Right;Left    Pain Descriptors / Indicators Discomfort    Pain Type Chronic pain    Pain Onset More than a month ago    Pain Frequency Intermittent              [facility] PT Assessment - 11/25/19 0001      Assessment   Medical Diagnosis right and left shoulder rotator cuff tendonopathy/impingement    Referring Provider (PT) Grier Mitts, PA-C    Hand  Dominance Right    Next MD Visit After PT      Strength   Right Shoulder Flexion 4-/5    Right Shoulder ABduction 3+/5    Right Shoulder Internal Rotation 4-/5    Right Shoulder External Rotation 4-/5    Left Shoulder Flexion 4-/5    Left Shoulder ABduction 3+/5    Left Shoulder Internal Rotation 4-/5    Left Shoulder External Rotation 4-/5                         [facility] Adult PT Treatment/Exercise - 11/25/19 0001      Exercises   Exercises Shoulder      Shoulder Exercises: Standing   Protraction Strengthening;Both;20 reps;Theraband;10 reps    Theraband Level (Shoulder Protraction) Level 3 (Green)    Horizontal ABduction Strengthening;Both;20 reps;Theraband;10 reps    Theraband Level (Shoulder Horizontal ABduction) Level 3 (Green)    External Rotation Strengthening;Both;20 reps;Theraband;10 reps    Theraband Level (Shoulder External Rotation) Level 3 (Green)    Internal Rotation Strengthening;Both;20 reps;Theraband;10 reps    Theraband Level (Shoulder Internal Rotation) Level 3 (Green)    Extension Strengthening;Both;10 reps;20 reps;Theraband    Theraband Level (  Shoulder Extension) Level 3 (Green)    Row Strengthening;Both;20 reps;10 reps;Theraband    Theraband Level (Shoulder Row) Level 3 (Green)      Shoulder Exercises: Pulleys   Flexion 3 minutes      Shoulder Exercises: ROM/Strengthening   UBE (Upper Arm Bike) 120 RPM 8 mins (4 fwd, 4bwd)    Wall Wash bilateral wall wash       Modalities   Modalities Ultrasound      Ultrasound   Ultrasound Location bilateral shoulders    Ultrasound Parameters combo 1.5 w/cm2 100%    Ultrasound Goals Pain                       PT Long Term Goals - 11/25/19 1449      PT LONG TERM GOAL #1   Title Patient will be independent with HEP    Time 6    Period Weeks    Status On-going      PT LONG TERM GOAL #2   Title Patient will report ability to perform ADLs witih bilateral shoulder pain less than or  equal to 2/10.    Time 6    Period Weeks    Status On-going      PT LONG TERM GOAL #3   Title Patient will report ability to don/doff bra with bilateral shoulder pain less than or equal to 2/10.    Time 6    Period Weeks    Status On-going      PT LONG TERM GOAL #4   Title Patient will demonstrate 4/5 or greater bilateral shoulder MMT to improve stability during functional tasks.    Time 6    Period Weeks    Status On-going                 Plan - 11/25/19 1442    Clinical Impression Statement Patient responded well to therapy session but with ongoing tightness in bilateral shoulders at end range. Patient  guided through TEs and was able to progress with no complaints of pain. Patient has made improvements in MMT bilaterally; goal ongoing at this time. No adverse effects to combo upon completion    Personal Factors and Comorbidities Age;Comorbidity 2    Comorbidities HTN, DM    Examination-Activity Limitations Carry;Reach Overhead    Stability/Clinical Decision Making Stable/Uncomplicated    Clinical Decision Making Low    Rehab Potential Good    PT Frequency 2x / week    PT Duration 6 weeks    PT Treatment/Interventions ADLs/Self Care Home Management;Cryotherapy;Electrical Stimulation;Iontophoresis 4mg /ml Dexamethasone;Moist Heat;Ultrasound;Neuromuscular re-education;Balance training;Therapeutic exercise;Therapeutic activities;Functional mobility training;Patient/family education;Vasopneumatic Device;Taping;Dry needling;Passive range of motion;Manual techniques    PT Next Visit Plan UBE, postural exercises, gentle shoulder exercises and stretching, modalities PRN for pain relief.    PT Home Exercise Plan see patient education section    Consulted and Agree with Plan of Care Patient           Patient will benefit from skilled therapeutic intervention in order to improve the following deficits and impairments:  Decreased activity tolerance, Decreased strength, Decreased  range of motion, Pain, Postural dysfunction, Impaired UE functional use  Visit Diagnosis: Chronic right shoulder pain  Chronic left shoulder pain  Muscle weakness (generalized)  Abnormal posture     Problem List Patient Active Problem List   Diagnosis Date Noted  . Type II or unspecified type diabetes mellitus without mention of complication, not stated as uncontrolled 12/15/2013    Class:  History of  . Accelerated hypertension 12/15/2013  . Elevated cholesterol 12/15/2013    Class: History of    [name], PT, DPT 11/25/2019, 3:26 PM  [facility] [address] [place], [place], [address] Phone: [phone]   Fax:  [phone]  Name: Gabrielle Russell MRN: [id number] Date of Birth: 27-May-1965

## 2019-11-30 ENCOUNTER — Encounter: Payer: Self-pay | Admitting: Physical Therapy

## 2019-11-30 ENCOUNTER — Other Ambulatory Visit: Payer: Self-pay

## 2019-11-30 ENCOUNTER — Ambulatory Visit: Payer: BC Managed Care – PPO | Admitting: Physical Therapy

## 2019-11-30 DIAGNOSIS — M25512 Pain in left shoulder: Secondary | ICD-10-CM | POA: Diagnosis not present

## 2019-11-30 DIAGNOSIS — F329 Major depressive disorder, single episode, unspecified: Secondary | ICD-10-CM | POA: Diagnosis not present

## 2019-11-30 DIAGNOSIS — F3181 Bipolar II disorder: Secondary | ICD-10-CM | POA: Diagnosis not present

## 2019-11-30 DIAGNOSIS — R293 Abnormal posture: Secondary | ICD-10-CM | POA: Diagnosis not present

## 2019-11-30 DIAGNOSIS — G8929 Other chronic pain: Secondary | ICD-10-CM | POA: Diagnosis not present

## 2019-11-30 DIAGNOSIS — M6281 Muscle weakness (generalized): Secondary | ICD-10-CM

## 2019-11-30 DIAGNOSIS — M25511 Pain in right shoulder: Secondary | ICD-10-CM | POA: Diagnosis not present

## 2019-11-30 NOTE — Therapy (Signed)
[facility] [place], [place], [address] Phone: [phone]   Fax:  [phone]  Physical Therapy Treatment  Patient Details  Name: CAYLEA FORONDA MRN: [id number] Date of Birth: 1965/05/26 Referring Provider (PT): Grier Mitts, [place]   Encounter Date: 11/30/2019   PT End of Session - 11/30/19 1344    Visit Number 7    Number of Visits 12    Date for PT Re-Evaluation 12/21/19    Authorization Type FOTO initial: 37% limitation; Progress note every 10th visit    PT Start Time 1346    PT Stop Time 1430    PT Time Calculation (min) 44 min    Activity Tolerance Patient tolerated treatment well    Behavior During Therapy [facility] for tasks assessed/performed           Past Medical History:  Diagnosis Date  . Amenorrhea   . Anxiety   . Depression   . Diabetes mellitus without complication ([place])   . Dyspareunia   . Hypertension   . Urinary incontinence     Past Surgical History:  Procedure Laterality Date  . CARPAL TUNNEL RELEASE Right   . CESAREAN SECTION    . TUBAL LIGATION      There were no vitals filed for this visit.   Subjective Assessment - 11/30/19 1344    Subjective COVID-19 screening performed upon arrival. Patient reports more stiffness today bilaterally for unknown reason.    Pertinent History HTN, DM    Limitations Lifting;House hold activities    Diagnostic tests x-ray: normal results    Patient Stated Goals improve movement, dress with less pain    Currently in Pain? Yes    Pain Score --   No pain score provided   Pain Location Shoulder    Pain Orientation Left;Right    Pain Descriptors / Indicators Other (Comment);Discomfort   Stiffness   Pain Type Chronic pain    Pain Onset More than a month ago    Pain Frequency Intermittent              [facility] PT Assessment - 11/30/19 0001      Assessment   Medical Diagnosis right and left shoulder rotator cuff tendonopathy/impingement     Referring Provider (PT) Grier Mitts, PA-C    Hand Dominance Right    Next MD Visit After PT    Prior Therapy for left about 1 year ago       Precautions   Precautions None      Restrictions   Weight Bearing Restrictions No                         [facility] Adult PT Treatment/Exercise - 11/30/19 0001      Shoulder Exercises: Standing   External Rotation Strengthening;Both;20 reps;Theraband;10 reps    Theraband Level (Shoulder External Rotation) Level 3 (Green)    Internal Rotation Strengthening;Both;20 reps;Theraband;10 reps    Theraband Level (Shoulder Internal Rotation) Level 3 (Green)    Extension Strengthening;Both;10 reps;20 reps;Theraband    Theraband Level (Shoulder Extension) Level 3 (Green)    Row Strengthening;Both;20 reps;10 reps;Theraband    Theraband Level (Shoulder Row) Level 3 (Green)      Shoulder Exercises: ROM/Strengthening   UBE (Upper Arm Bike) 90 RPM 8 mins (4 fwd, 4bwd)    Wall Wash BUE wall wash CW and CCW to fatigue each    Other ROM/Strengthening Exercises Wall slide into flexion x10 reps each  Shoulder Exercises: Stretch   Corner Stretch 3 reps;30 seconds      Modalities   Modalities Ultrasound      Ultrasound   Ultrasound Location B shoulder    Ultrasound Parameters Combo 1.5 w/cm2, 100%, 1 mhz x10 min    Ultrasound Goals Pain                       PT Long Term Goals - 11/25/19 1449      PT LONG TERM GOAL #1   Title Patient will be independent with HEP    Time 6    Period Weeks    Status On-going      PT LONG TERM GOAL #2   Title Patient will report ability to perform ADLs witih bilateral shoulder pain less than or equal to 2/10.    Time 6    Period Weeks    Status On-going      PT LONG TERM GOAL #3   Title Patient will report ability to don/doff bra with bilateral shoulder pain less than or equal to 2/10.    Time 6    Period Weeks    Status On-going      PT LONG TERM GOAL #4   Title Patient  will demonstrate 4/5 or greater bilateral shoulder MMT to improve stability during functional tasks.    Time 6    Period Weeks    Status On-going                 Plan - 11/30/19 1508    Clinical Impression Statement Patient presented in clinic with reports of more B shoulder stiffness upon arrival. Patient able to tolerate therex session well with greatest report of discomfort during corner stretch and end range ER. Patient indicated more anterior shoulder pain prior to starting [place] to B shoulder and also presented more tone of R deltoids. Normal [place] response noted to B shoulders.    Personal Factors and Comorbidities Age;Comorbidity 2    Comorbidities HTN, DM    Examination-Activity Limitations Carry;Reach Overhead    Stability/Clinical Decision Making Stable/Uncomplicated    Rehab Potential Good    PT Frequency 2x / week    PT Duration 6 weeks    PT Treatment/Interventions ADLs/Self Care Home Management;Cryotherapy;Electrical Stimulation;Iontophoresis 4mg /ml Dexamethasone;Moist Heat;Ultrasound;Neuromuscular re-education;Balance training;Therapeutic exercise;Therapeutic activities;Functional mobility training;Patient/family education;Vasopneumatic Device;Taping;Dry needling;Passive range of motion;Manual techniques    PT Next Visit Plan UBE, postural exercises, gentle shoulder exercises and stretching, modalities PRN for pain relief.    PT Home Exercise Plan see patient education section    Consulted and Agree with Plan of Care Patient           Patient will benefit from skilled therapeutic intervention in order to improve the following deficits and impairments:  Decreased activity tolerance, Decreased strength, Decreased range of motion, Pain, Postural dysfunction, Impaired UE functional use  Visit Diagnosis: Chronic right shoulder pain  Chronic left shoulder pain  Muscle weakness (generalized)  Abnormal posture     Problem List Patient Active Problem List   Diagnosis  Date Noted  . Type II or unspecified type diabetes mellitus without mention of complication, not stated as uncontrolled 12/15/2013    Class: History of  . Accelerated hypertension 12/15/2013  . Elevated cholesterol 12/15/2013    Class: History of    Standley Brooking, PTA 11/30/2019, 3:13 PM  [facility] [address] [place], [place], [address] Phone: [phone]   Fax:  [phone]  Name: CARNELLA FRYMAN MRN: [id number] Date of Birth: 03-May-1966

## 2019-12-02 ENCOUNTER — Ambulatory Visit: Payer: BC Managed Care – PPO | Admitting: Physical Therapy

## 2019-12-02 ENCOUNTER — Other Ambulatory Visit: Payer: Self-pay

## 2019-12-02 DIAGNOSIS — R293 Abnormal posture: Secondary | ICD-10-CM | POA: Diagnosis not present

## 2019-12-02 DIAGNOSIS — M25512 Pain in left shoulder: Secondary | ICD-10-CM | POA: Diagnosis not present

## 2019-12-02 DIAGNOSIS — M25511 Pain in right shoulder: Secondary | ICD-10-CM | POA: Diagnosis not present

## 2019-12-02 DIAGNOSIS — M6281 Muscle weakness (generalized): Secondary | ICD-10-CM

## 2019-12-02 DIAGNOSIS — G8929 Other chronic pain: Secondary | ICD-10-CM

## 2019-12-02 NOTE — Therapy (Signed)
[facility] [place], [place], [address] Phone: [phone]   Fax:  [phone]  Physical Therapy Treatment  Patient Details  Name: Gabrielle Russell MRN: [id number] Date of Birth: 30-Oct-1965 Referring Provider (PT): Grier Mitts, [place]   Encounter Date: 12/02/2019   PT End of Session - 12/02/19 1521    Visit Number 8    Number of Visits 12    Date for PT Re-Evaluation 12/21/19    Authorization Type FOTO initial: 37% limitation; Progress note every 10th visit    PT Start Time 0315    PT Stop Time 0356    PT Time Calculation (min) 41 min    Activity Tolerance Patient tolerated treatment well    Behavior During Therapy [facility] for tasks assessed/performed           Past Medical History:  Diagnosis Date  . Amenorrhea   . Anxiety   . Depression   . Diabetes mellitus without complication ([place])   . Dyspareunia   . Hypertension   . Urinary incontinence     Past Surgical History:  Procedure Laterality Date  . CARPAL TUNNEL RELEASE Right   . CESAREAN SECTION    . TUBAL LIGATION      There were no vitals filed for this visit.   Subjective Assessment - 12/02/19 1518    Subjective COVID-19 screening performed upon arrival. Patient reports ongoing stiffness in bil shoulders    Pertinent History HTN, DM    Limitations Lifting;House hold activities    Diagnostic tests x-ray: normal results    Patient Stated Goals improve movement, dress with less pain    Currently in Pain? Yes    Pain Score --   no number provided   Pain Location Shoulder    Pain Orientation Right;Left    Pain Descriptors / Indicators Other (Comment)   stiffness   Pain Type Chronic pain    Pain Onset More than a month ago    Pain Frequency Intermittent    Aggravating Factors  prolong position    Pain Relieving Factors movement                             [facility] Adult PT Treatment/Exercise - 12/02/19 0001      Shoulder  Exercises: Standing   External Rotation Strengthening;Both;20 reps;Theraband;10 reps    Theraband Level (Shoulder External Rotation) Level 3 (Green)    Internal Rotation Strengthening;Both;20 reps;Theraband;10 reps    Theraband Level (Shoulder Internal Rotation) Level 3 (Green)    Extension Strengthening;Both;10 reps;20 reps;Theraband    Theraband Level (Shoulder Extension) Level 3 (Green)    Row Strengthening;Both;20 reps;10 reps;Theraband    Theraband Level (Shoulder Row) Level 3 (Green)      Shoulder Exercises: ROM/Strengthening   UBE (Upper Arm Bike) 90 RPM 8 mins (4 fwd, 4bwd)    Wall Wash BUE wall wash CW and CCW to fatigue each    Other ROM/Strengthening Exercises Wall slide into flexion x10 reps each      Shoulder Exercises: Stretch   Other Shoulder Stretches self ant and post cap stretch bil 30sec x2each    Other Shoulder Stretches demo for IR self stretch      Modalities   Modalities Ultrasound      Ultrasound   Ultrasound Location B shoulder    Ultrasound Parameters combo US/ES @1 .5w/cm2/100%/43mhz x65min  PT Long Term Goals - 12/02/19 1520      PT LONG TERM GOAL #1   Title Patient will be independent with HEP    Time 6    Period Weeks    Status On-going      PT LONG TERM GOAL #2   Title Patient will report ability to perform ADLs witih bilateral shoulder pain less than or equal to 2/10.    Time 6    Period Weeks    Status On-going      PT LONG TERM GOAL #3   Title Patient will report ability to don/doff bra with bilateral shoulder pain less than or equal to 2/10.    Time 6    Period Weeks    Status On-going      PT LONG TERM GOAL #4   Title Patient will demonstrate 4/5 or greater bilateral shoulder MMT to improve stability during functional tasks.    Time 6    Period Weeks    Status On-going                 Plan - 12/02/19 1556    Clinical Impression Statement Patient tolerated treatment well today. Patient  able to perform activities with greater ease and feels like there is improvement in bil shoulders. Patient is unable to donn/doff bra due to tightness and continues to have some limitations with prolong movements. Patient goals ongoing at this time. Patient is doing HEP and does not need an updated one this week per discussion.    Personal Factors and Comorbidities Age;Comorbidity 2    Examination-Activity Limitations Carry;Reach Overhead    Stability/Clinical Decision Making Stable/Uncomplicated    Rehab Potential Good    PT Frequency 2x / week    PT Duration 6 weeks    PT Treatment/Interventions ADLs/Self Care Home Management;Cryotherapy;Electrical Stimulation;Iontophoresis 4mg /ml Dexamethasone;Moist Heat;Ultrasound;Neuromuscular re-education;Balance training;Therapeutic exercise;Therapeutic activities;Functional mobility training;Patient/family education;Vasopneumatic Device;Taping;Dry needling;Passive range of motion;Manual techniques    PT Next Visit Plan cont with POC for  postural exercises, gentle shoulder exercises and stretching, modalities PRN for pain relief.    Consulted and Agree with Plan of Care Patient           Patient will benefit from skilled therapeutic intervention in order to improve the following deficits and impairments:  Decreased activity tolerance, Decreased strength, Decreased range of motion, Pain, Postural dysfunction, Impaired UE functional use  Visit Diagnosis: Chronic right shoulder pain  Chronic left shoulder pain  Muscle weakness (generalized)  Abnormal posture     Problem List Patient Active Problem List   Diagnosis Date Noted  . Type II or unspecified type diabetes mellitus without mention of complication, not stated as uncontrolled 12/15/2013    Class: History of  . Accelerated hypertension 12/15/2013  . Elevated cholesterol 12/15/2013    Class: History of    Samin Milke P, PTA 12/02/2019, 4:00 PM  [facility] [address] [place], [place], [address] Phone: [phone]   Fax:  [phone]  Name: Gabrielle Russell MRN: [id number] Date of Birth: 22-Aug-1965

## 2019-12-07 ENCOUNTER — Other Ambulatory Visit: Payer: Self-pay

## 2019-12-07 ENCOUNTER — Ambulatory Visit: Payer: BC Managed Care – PPO | Admitting: Physical Therapy

## 2019-12-07 ENCOUNTER — Encounter: Payer: Self-pay | Admitting: Physical Therapy

## 2019-12-07 DIAGNOSIS — R293 Abnormal posture: Secondary | ICD-10-CM

## 2019-12-07 DIAGNOSIS — M25511 Pain in right shoulder: Secondary | ICD-10-CM | POA: Diagnosis not present

## 2019-12-07 DIAGNOSIS — M25512 Pain in left shoulder: Secondary | ICD-10-CM

## 2019-12-07 DIAGNOSIS — M6281 Muscle weakness (generalized): Secondary | ICD-10-CM | POA: Diagnosis not present

## 2019-12-07 DIAGNOSIS — G8929 Other chronic pain: Secondary | ICD-10-CM | POA: Diagnosis not present

## 2019-12-07 NOTE — Therapy (Signed)
[facility] [place], [place], [address] Phone: [phone]   Fax:  [phone]  Physical Therapy Treatment  Patient Details  Name: Gabrielle Russell MRN: [id number] Date of Birth: 1965/11/30 Referring Provider (PT): Grier Mitts, [place]   Encounter Date: 12/07/2019   PT End of Session - 12/07/19 1310    Visit Number 9    Number of Visits 12    Date for PT Re-Evaluation 12/21/19    Authorization Type FOTO initial: 37% limitation; Progress note every 10th visit    PT Start Time 1301    PT Stop Time 1351    PT Time Calculation (min) 50 min    Activity Tolerance Patient tolerated treatment well    Behavior During Therapy [facility] for tasks assessed/performed           Past Medical History:  Diagnosis Date  . Amenorrhea   . Anxiety   . Depression   . Diabetes mellitus without complication ([place])   . Dyspareunia   . Hypertension   . Urinary incontinence     Past Surgical History:  Procedure Laterality Date  . CARPAL TUNNEL RELEASE Right   . CESAREAN SECTION    . TUBAL LIGATION      There were no vitals filed for this visit.   Subjective Assessment - 12/07/19 1309    Subjective COVID-19 screening performed upon arrival. Patient reports ongoing stiffness in B shoulders after going kayaking over the weekend.    Pertinent History HTN, DM    Limitations Lifting;House hold activities    Diagnostic tests x-ray: normal results    Patient Stated Goals improve movement, dress with less pain    Currently in Pain? Yes    Pain Score 5     Pain Location Shoulder    Pain Orientation Right;Left    Pain Descriptors / Indicators Other (Comment)   stiffness   Pain Type Chronic pain    Pain Radiating Towards Numbness reported to B 1-3rd fingers    Pain Onset More than a month ago    Pain Frequency Intermittent              [facility] PT Assessment - 12/07/19 0001      Assessment   Medical Diagnosis right and left shoulder  rotator cuff tendonopathy/impingement    Referring Provider (PT) Grier Mitts, PA-C    Hand Dominance Right    Next MD Visit After PT    Prior Therapy for left about 1 year ago       Precautions   Precautions None      Restrictions   Weight Bearing Restrictions No                         [facility] Adult PT Treatment/Exercise - 12/07/19 0001      Shoulder Exercises: Standing   Protraction Strengthening;Both;20 reps;10 reps;Theraband    Theraband Level (Shoulder Protraction) Level 3 (Green)    External Rotation Strengthening;Both;20 reps;Theraband;10 reps    Theraband Level (Shoulder External Rotation) Level 3 (Green)    Internal Rotation Strengthening;Both;20 reps;Theraband;10 reps    Theraband Level (Shoulder Internal Rotation) Level 3 (Green)    Extension Both;20 reps;10 reps;Theraband;Strengthening    Theraband Level (Shoulder Extension) Level 3 (Green)    Extension Limitations AAROM ext x20 rps    Row Strengthening;Both;20 reps;10 reps;Theraband    Theraband Level (Shoulder Row) Level 3 (Green)    Row Limitations B shoulder row green XTS x20 reps  each      Shoulder Exercises: Pulleys   Flexion 5 minutes      Shoulder Exercises: ROM/Strengthening   UBE (Upper Arm Bike) 90 RPM 8 mins (4 fwd, 4bwd)    Wall Pushups 20 reps    X to V Arms x15 reps      Modalities   Modalities Ultrasound      Ultrasound   Ultrasound Location B shoulder     Ultrasound Parameters Combo 1.5 w/cm2, 100%, 1 mhz x8 min each    Ultrasound Goals Pain                       PT Long Term Goals - 12/07/19 1324      PT LONG TERM GOAL #1   Title Patient will be independent with HEP    Time 6    Period Weeks    Status Achieved      PT LONG TERM GOAL #2   Title Patient will report ability to perform ADLs witih bilateral shoulder pain less than or equal to 2/10.    Time 6    Period Weeks    Status Achieved      PT LONG TERM GOAL #3   Title Patient will report  ability to don/doff bra with bilateral shoulder pain less than or equal to 2/10.    Time 6    Period Weeks    Status Partially Met      PT LONG TERM GOAL #4   Title Patient will demonstrate 4/5 or greater bilateral shoulder MMT to improve stability during functional tasks.    Time 6    Period Weeks    Status On-going                 Plan - 12/07/19 1422    Clinical Impression Statement Patient presented in clinic with reports of B shoulder stiffness after kayaking this weekend. Patient able to tolerate all therex fairly well although limited as she fatigued more quickly per patient report. Patient able to achieve goals in regards to ADLs but limited with donning and doffing bra as she did prior to onset of condition. Patient independent with rotating her bra to don and doff though. Normal [place] response noted following combo [place] session. Patient stated she felt less B shoulder stiffness following end of treatment.    Personal Factors and Comorbidities Age;Comorbidity 2    Comorbidities HTN, DM    Examination-Activity Limitations Carry;Reach Overhead    Stability/Clinical Decision Making Stable/Uncomplicated    Rehab Potential Good    PT Frequency 2x / week    PT Duration 6 weeks    PT Treatment/Interventions ADLs/Self Care Home Management;Cryotherapy;Electrical Stimulation;Iontophoresis 35m/ml Dexamethasone;Moist Heat;Ultrasound;Neuromuscular re-education;Balance training;Therapeutic exercise;Therapeutic activities;Functional mobility training;Patient/family education;Vasopneumatic Device;Taping;Dry needling;Passive range of motion;Manual techniques    PT Next Visit Plan cont with POC for  postural exercises, gentle shoulder exercises and stretching, modalities PRN for pain relief.    PT Home Exercise Plan see patient education section    Consulted and Agree with Plan of Care Patient           Patient will benefit from skilled therapeutic intervention in order to improve the  following deficits and impairments:  Decreased activity tolerance, Decreased strength, Decreased range of motion, Pain, Postural dysfunction, Impaired UE functional use  Visit Diagnosis: Chronic right shoulder pain  Chronic left shoulder pain  Muscle weakness (generalized)  Abnormal posture     Problem List Patient Active Problem List  Diagnosis Date Noted  . Type II or unspecified type diabetes mellitus without mention of complication, not stated as uncontrolled 12/15/2013    Class: History of  . Accelerated hypertension 12/15/2013  . Elevated cholesterol 12/15/2013    Class: History of    Standley Brooking, PTA 12/07/2019, 2:29 PM  [facility] [address] [place], [place], [address] Phone: [phone]   Fax:  [phone]  Name: ALEJANDRA HUNT MRN: [id number] Date of Birth: Nov 11, 1965

## 2019-12-09 ENCOUNTER — Encounter: Payer: Self-pay | Admitting: Physical Therapy

## 2019-12-09 ENCOUNTER — Other Ambulatory Visit: Payer: Self-pay

## 2019-12-09 ENCOUNTER — Ambulatory Visit: Payer: BC Managed Care – PPO | Admitting: Physical Therapy

## 2019-12-09 DIAGNOSIS — G8929 Other chronic pain: Secondary | ICD-10-CM

## 2019-12-09 DIAGNOSIS — R293 Abnormal posture: Secondary | ICD-10-CM

## 2019-12-09 DIAGNOSIS — M6281 Muscle weakness (generalized): Secondary | ICD-10-CM

## 2019-12-09 DIAGNOSIS — M25512 Pain in left shoulder: Secondary | ICD-10-CM | POA: Diagnosis not present

## 2019-12-09 DIAGNOSIS — M25511 Pain in right shoulder: Secondary | ICD-10-CM | POA: Diagnosis not present

## 2019-12-09 NOTE — Therapy (Signed)
[facility] [place], [place], [address] Phone: [phone]   Fax:  [phone]  Physical Therapy Treatment  Patient Details  Name: Gabrielle Russell MRN: [id number] Date of Birth: Jan 03, 1966 Referring Provider (PT): Grier Mitts, [place]   Encounter Date: 12/09/2019   PT End of Session - 12/09/19 1119    Visit Number 10    Number of Visits 18    Date for PT Re-Evaluation 01/07/20    Authorization Type FOTO initial: 37% limitation; Progress note every 10th visit    PT Start Time 1032    PT Stop Time 1117    PT Time Calculation (min) 45 min    Activity Tolerance Patient tolerated treatment well    Behavior During Therapy [facility] for tasks assessed/performed           Past Medical History:  Diagnosis Date  . Amenorrhea   . Anxiety   . Depression   . Diabetes mellitus without complication ([place])   . Dyspareunia   . Hypertension   . Urinary incontinence     Past Surgical History:  Procedure Laterality Date  . CARPAL TUNNEL RELEASE Right   . CESAREAN SECTION    . TUBAL LIGATION      There were no vitals filed for this visit.   Subjective Assessment - 12/09/19 1043    Subjective COVID-19 screening performed upon arrival. Patient reports continued stiffness and limitation with lifting heavy things, reaching behind her and ER for reaching is limited via ROM.Marland Kitchen    Pertinent History HTN, DM    Limitations Lifting;House hold activities    Diagnostic tests x-ray: normal results    Patient Stated Goals improve movement, dress with less pain    Currently in Pain? Yes    Pain Score 3     Pain Location Shoulder    Pain Orientation Left;Right    Pain Descriptors / Indicators Other (Comment)   stiffness   Pain Type Chronic pain    Pain Onset More than a month ago    Pain Frequency Intermittent              [facility] PT Assessment - 12/09/19 0001      Assessment   Medical Diagnosis right and left shoulder rotator cuff  tendonopathy/impingement    Referring Provider (PT) Grier Mitts, PA-C    Hand Dominance Right    Next MD Visit After PT    Prior Therapy for left about 1 year ago       Precautions   Precautions None      Restrictions   Weight Bearing Restrictions No                         [facility] Adult PT Treatment/Exercise - 12/09/19 0001      Shoulder Exercises: Standing   Protraction Strengthening;Both;20 reps;Theraband    Theraband Level (Shoulder Protraction) Level 3 (Green)    External Rotation Strengthening;Both;20 reps;Theraband    Theraband Level (Shoulder External Rotation) Level 3 (Green)    Internal Rotation Strengthening;Both;20 reps;Theraband    Theraband Level (Shoulder Internal Rotation) Level 3 (Green)    Row Strengthening;Both;20 reps;Theraband    Theraband Level (Shoulder Row) Level 3 (Green)      Shoulder Exercises: Pulleys   Flexion 5 minutes      Shoulder Exercises: ROM/Strengthening   UBE (Upper Arm Bike) 90 RPM 8 mins (4 fwd, 4bwd)    Wall Wash into flex x5 reps each with ovepressure  Modalities   Modalities Ultrasound      Ultrasound   Ultrasound Location B shoulder    Ultrasound Parameters Combo 1.5 w/cm2, 100%, 1 mhz x10 min    Ultrasound Goals Pain                       PT Long Term Goals - 12/07/19 1324      PT LONG TERM GOAL #1   Title Patient will be independent with HEP    Time 6    Period Weeks    Status Achieved      PT LONG TERM GOAL #2   Title Patient will report ability to perform ADLs witih bilateral shoulder pain less than or equal to 2/10.    Time 6    Period Weeks    Status Achieved      PT LONG TERM GOAL #3   Title Patient will report ability to don/doff bra with bilateral shoulder pain less than or equal to 2/10.    Time 6    Period Weeks    Status Partially Met      PT LONG TERM GOAL #4   Title Patient will demonstrate 4/5 or greater bilateral shoulder MMT to improve stability during  functional tasks.    Time 6    Period Weeks    Status On-going                 Plan - 12/09/19 1128    Clinical Impression Statement Patient presented in clinic with reports of continued UE ROM limitation as well as discomfort with sleeping. Patient having more difficulty sleeping on R shoulder but shows some limitation with L shoulder. Patient progressed through more ROM and stretching today with reports of greater discomfort with corner stretch. Normal combo [place] response noted following end of treatment.    Personal Factors and Comorbidities Age;Comorbidity 2    Comorbidities HTN, DM    Examination-Activity Limitations Carry;Reach Overhead    Stability/Clinical Decision Making Stable/Uncomplicated    Rehab Potential Good    PT Frequency 2x / week    PT Duration 6 weeks    PT Treatment/Interventions ADLs/Self Care Home Management;Cryotherapy;Electrical Stimulation;Iontophoresis 84m/ml Dexamethasone;Moist Heat;Ultrasound;Neuromuscular re-education;Balance training;Therapeutic exercise;Therapeutic activities;Functional mobility training;Patient/family education;Vasopneumatic Device;Taping;Dry needling;Passive range of motion;Manual techniques    PT Next Visit Plan cont with POC for  postural exercises, gentle shoulder exercises and stretching, modalities PRN for pain relief.    PT Home Exercise Plan see patient education section    Consulted and Agree with Plan of Care Patient           Patient will benefit from skilled therapeutic intervention in order to improve the following deficits and impairments:  Decreased activity tolerance, Decreased strength, Decreased range of motion, Pain, Postural dysfunction, Impaired UE functional use  Visit Diagnosis: Chronic right shoulder pain  Chronic left shoulder pain  Muscle weakness (generalized)  Abnormal posture     Problem List Patient Active Problem List   Diagnosis Date Noted  . Type II or unspecified type diabetes mellitus  without mention of complication, not stated as uncontrolled 12/15/2013    Class: History of  . Accelerated hypertension 12/15/2013  . Elevated cholesterol 12/15/2013    Class: History of    KStandley Brooking PTA 12/09/2019, 5:42 PM  [facility] [address]Phone: [phone]  Fax:  [phone] Name: Gabrielle HARDTMRN: [id number] of Birth: [birth year]

## 2019-12-21 DIAGNOSIS — F322 Major depressive disorder, single episode, severe without psychotic features: Secondary | ICD-10-CM | POA: Diagnosis not present

## 2019-12-22 ENCOUNTER — Ambulatory Visit: Payer: BC Managed Care – PPO | Attending: Surgical | Admitting: Physical Therapy

## 2019-12-22 ENCOUNTER — Other Ambulatory Visit: Payer: Self-pay

## 2019-12-22 ENCOUNTER — Encounter: Payer: Self-pay | Admitting: Physical Therapy

## 2019-12-22 DIAGNOSIS — M6281 Muscle weakness (generalized): Secondary | ICD-10-CM | POA: Diagnosis not present

## 2019-12-22 DIAGNOSIS — G8929 Other chronic pain: Secondary | ICD-10-CM

## 2019-12-22 DIAGNOSIS — R293 Abnormal posture: Secondary | ICD-10-CM | POA: Diagnosis not present

## 2019-12-22 DIAGNOSIS — M25511 Pain in right shoulder: Secondary | ICD-10-CM | POA: Insufficient documentation

## 2019-12-22 DIAGNOSIS — M25512 Pain in left shoulder: Secondary | ICD-10-CM | POA: Insufficient documentation

## 2019-12-22 NOTE — Therapy (Signed)
Gabrielle Russell, [place], [address] Phone: [phone]   Fax:  [phone]  Physical Therapy Treatment  Patient Details  Name: Gabrielle Russell MRN: [id number] Date of Birth: 11-25-1965 Referring Provider (PT): Gabrielle Russell, [place]   Encounter Date: 12/22/2019   PT End of Session - 12/22/19 1038    Visit Number 11    Number of Visits 18    Date for PT Re-Evaluation 01/07/20    Authorization Type FOTO initial: 37% limitation; Progress note every 10th visit    PT Start Time 1030    PT Stop Time 1115    PT Time Calculation (min) 45 min    Activity Tolerance Patient tolerated treatment well    Behavior During Therapy Gabrielle Russell Russell for tasks assessed/performed           Past Medical History:  Diagnosis Date  . Amenorrhea   . Anxiety   . Depression   . Diabetes mellitus without complication ([place])   . Dyspareunia   . Hypertension   . Urinary incontinence     Past Surgical History:  Procedure Laterality Date  . CARPAL TUNNEL RELEASE Right   . CESAREAN SECTION    . TUBAL LIGATION      There were no vitals filed for this visit.   Subjective Assessment - 12/22/19 1036    Subjective COVID 19 screening performed on patient upon arrival. Reports stiffness upon arrival.    Pertinent History HTN, DM    Limitations Lifting;House hold activities    Diagnostic tests x-ray: normal results    Patient Stated Goals improve movement, dress with less pain    Currently in Pain? Yes    Pain Score --   No pain rating provided   Pain Location Shoulder    Pain Orientation Left;Right    Pain Descriptors / Indicators Other (Comment)   stiffness   Pain Type Chronic pain    Pain Onset More than a month ago    Pain Frequency Intermittent              [facility] PT Assessment - 12/22/19 0001      Assessment   Medical Diagnosis right and left shoulder rotator cuff tendonopathy/impingement    Referring Provider (PT) Gabrielle Mitts, PA-C    Hand Dominance Right    Next MD Visit After PT    Prior Therapy for left about 1 year ago       Precautions   Precautions None      Restrictions   Weight Bearing Restrictions No                         [facility] Adult PT Treatment/Exercise - 12/22/19 0001      Shoulder Exercises: Supine   External Rotation AAROM;Both;20 reps    External Rotation Limitations 90/90 position; holds at end range    Flexion AAROM;Both;20 reps    Flexion Limitations holds at end range      Shoulder Exercises: Standing   Protraction Strengthening;Both;20 reps;Theraband    Theraband Level (Shoulder Protraction) Level 3 (Green)    External Rotation Strengthening;Both;20 reps;Theraband    Theraband Level (Shoulder External Rotation) Level 3 (Green)    Internal Rotation Strengthening;Both;20 reps;Theraband    Theraband Level (Shoulder Internal Rotation) Level 3 (Green)    Row Strengthening;Both;20 reps;Theraband    Theraband Level (Shoulder Row) Level 3 (Green)      Shoulder Exercises: ROM/Strengthening   UBE (Upper Arm Bike) 60  RPM 8 mins (4 fwd, 4bwd)    Wall Wash into flex x10 reps each with ovepressure      Modalities   Modalities Ultrasound      Ultrasound   Ultrasound Location B shoulder    Ultrasound Parameters Combo 1.5 w/cm2, 100%, 1 mhz x10 min    Ultrasound Goals Pain                       PT Long Term Goals - 12/07/19 1324      PT LONG TERM GOAL #1   Title Patient will be independent with HEP    Time 6    Period Weeks    Status Achieved      PT LONG TERM GOAL #2   Title Patient will report ability to perform ADLs witih bilateral shoulder pain less than or equal to 2/10.    Time 6    Period Weeks    Status Achieved      PT LONG TERM GOAL #3   Title Patient will report ability to don/doff bra with bilateral shoulder pain less than or equal to 2/10.    Time 6    Period Weeks    Status Partially Met      PT LONG TERM GOAL #4    Title Patient will demonstrate 4/5 or greater bilateral shoulder MMT to improve stability during functional tasks.    Time 6    Period Weeks    Status On-going                 Plan - 12/22/19 1210    Clinical Impression Statement Patient presented in clinic with reports of continued stiffness and ROM limitations. Patient guided through resisted therex but also AAROM with prolonged holds at end range for capsular stretching. Normal [place] response noted bilaterally.    Personal Factors and Comorbidities Age;Comorbidity 2    Comorbidities HTN, DM    Examination-Activity Limitations Carry;Reach Overhead    Stability/Clinical Decision Making Stable/Uncomplicated    Rehab Potential Good    PT Frequency 2x / week    PT Duration 6 weeks    PT Treatment/Interventions ADLs/Self Care Home Management;Cryotherapy;Electrical Stimulation;Iontophoresis 56m/ml Dexamethasone;Moist Heat;Ultrasound;Neuromuscular re-education;Balance training;Therapeutic exercise;Therapeutic activities;Functional mobility training;Patient/family education;Vasopneumatic Device;Taping;Dry needling;Passive range of motion;Manual techniques    PT Next Visit Plan cont with POC for  postural exercises, gentle shoulder exercises and stretching, modalities PRN for pain relief.    PT Home Exercise Plan see patient education section    Consulted and Agree with Plan of Care Patient           Patient will benefit from skilled therapeutic intervention in order to improve the following deficits and impairments:  Decreased activity tolerance, Decreased strength, Decreased range of motion, Pain, Postural dysfunction, Impaired UE functional use  Visit Diagnosis: Chronic right shoulder pain  Chronic left shoulder pain  Muscle weakness (generalized)  Abnormal posture     Problem List Patient Active Problem List   Diagnosis Date Noted  . Type II or unspecified type diabetes mellitus without mention of complication, not stated  as uncontrolled 12/15/2013    Class: History of  . Accelerated hypertension 12/15/2013  . Elevated cholesterol 12/15/2013    Class: History of    Gabrielle Russell PTA 12/22/2019, 12:15 PM  [facility] [place] [address]Phone: [phone]  Fax:  [phone] Name: Gabrielle LOYALMRN: [id number] of Birth: 8February 26, 1967

## 2019-12-30 ENCOUNTER — Ambulatory Visit: Payer: BC Managed Care – PPO | Admitting: *Deleted

## 2019-12-30 ENCOUNTER — Other Ambulatory Visit: Payer: Self-pay

## 2019-12-30 DIAGNOSIS — M6281 Muscle weakness (generalized): Secondary | ICD-10-CM | POA: Diagnosis not present

## 2019-12-30 DIAGNOSIS — M25512 Pain in left shoulder: Secondary | ICD-10-CM

## 2019-12-30 DIAGNOSIS — M25511 Pain in right shoulder: Secondary | ICD-10-CM | POA: Diagnosis not present

## 2019-12-30 DIAGNOSIS — G8929 Other chronic pain: Secondary | ICD-10-CM | POA: Diagnosis not present

## 2019-12-30 DIAGNOSIS — R293 Abnormal posture: Secondary | ICD-10-CM | POA: Diagnosis not present

## 2019-12-30 NOTE — Therapy (Signed)
[facility] [address] [place], [place], [address] Phone: [phone]   Fax:  [phone]  Physical Therapy Treatment  Patient Details  Name: Gabrielle Russell MRN: [id number] Date of Birth: 06/01/1965 Referring Provider (PT): Danielle Laliberte, PA-C   Encounter Date: 12/30/2019   PT End of Session - 12/30/19 1433    Visit Number 12    Number of Visits 18    Date for PT Re-Evaluation 01/07/20    Authorization Type FOTO initial: 37% limitation; Progress note every 10th visit    PT Start Time 1430    PT Stop Time 1519    PT Time Calculation (min) 49 min           Past Medical History:  Diagnosis Date  . Amenorrhea   . Anxiety   . Depression   . Diabetes mellitus without complication ([facility])   . Dyspareunia   . Hypertension   . Urinary incontinence     Past Surgical History:  Procedure Laterality Date  . CARPAL TUNNEL RELEASE Right   . CESAREAN SECTION    . TUBAL LIGATION      There were no vitals filed for this visit.                      [facility] Adult PT Treatment/Exercise - 12/30/19 0001      Shoulder Exercises: Seated   External Rotation Strengthening;Both;20 reps    External Rotation Weight (lbs) 1      Shoulder Exercises: Standing   Protraction Strengthening;Both;20 reps;Theraband    Theraband Level (Shoulder Protraction) Level 3 (Green)    External Rotation Strengthening;Both;20 reps;Theraband    Theraband Level (Shoulder External Rotation) Level 3 (Green)    Internal Rotation Strengthening;Both;20 reps;Theraband    Theraband Level (Shoulder Internal Rotation) Level 3 (Green)    Flexion Strengthening;Both;20 reps    Shoulder Flexion Weight (lbs) 1    Row Strengthening;Both;20 reps;Theraband    Theraband Level (Shoulder Row) Level 3 (Green)      Shoulder Exercises: ROM/Strengthening   UBE (Upper Arm Bike) 60 RPM 8 mins (4 fwd, 4bwd)      Modalities   Modalities Electrical Stimulation       Electrical Stimulation   Electrical Stimulation Location Bil shldrs    Electrical Stimulation Action premod    Electrical Stimulation Parameters 80-150hz 80-150hz x 15 mins    Electrical Stimulation Goals Pain      Manual Therapy   Manual Therapy Soft tissue mobilization    Soft tissue mobilization STW/ IASTW to Bil shldrs posteriolateral aspect                       PT Long Term Goals - 12/07/19 1324      PT LONG TERM GOAL #1   Title Patient will be independent with HEP    Time 6    Period Weeks    Status Achieved      PT LONG TERM GOAL #2   Title Patient will report ability to perform ADLs witih bilateral shoulder pain less than or equal to 2/10.    Time 6    Period Weeks    Status Achieved      PT LONG TERM GOAL #3   Title Patient will report ability to don/doff bra with bilateral shoulder pain less than or equal to 2/10.    Time 6    Period Weeks    Status Partially Met        PT LONG TERM GOAL #4   Title Patient will demonstrate 4/5 or greater bilateral shoulder MMT to improve stability during functional tasks.    Time 6    Period Weeks    Status On-going                 Plan - 12/30/19 1434    Clinical Impression Statement Pt arrived today with some pain/soreness both shldrs, but did ok after last Rx. Pt performed light strengthening exs with focus on eccentrics. Manual STW/ IASTM to Bil shldrs posteriolateral aspects in sittinf. Normal modality response today    Comorbidities HTN, DM    Examination-Activity Limitations Carry;Reach Overhead    Stability/Clinical Decision Making Stable/Uncomplicated    PT Frequency 2x / week    PT Treatment/Interventions ADLs/Self Care Home Management;Cryotherapy;Electrical Stimulation;Iontophoresis 74m/ml Dexamethasone;Moist Heat;Ultrasound;Neuromuscular re-education;Balance training;Therapeutic exercise;Therapeutic activities;Functional mobility training;Patient/family education;Vasopneumatic Device;Taping;Dry  needling;Passive range of motion;Manual techniques    PT Next Visit Plan cont with POC for  postural exercises, gentle shoulder exercises and stretching, modalities PRN for pain relief.    PT Home Exercise Plan see patient education section    Consulted and Agree with Plan of Care Patient           Patient will benefit from skilled therapeutic intervention in order to improve the following deficits and impairments:  Decreased activity tolerance, Decreased strength, Decreased range of motion, Pain, Postural dysfunction, Impaired UE functional use  Visit Diagnosis: Chronic right shoulder pain  Chronic left shoulder pain  Muscle weakness (generalized)  Abnormal posture     Problem List Patient Active Problem List   Diagnosis Date Noted  . Type II or unspecified type diabetes mellitus without mention of complication, not stated as uncontrolled 12/15/2013    Class: History of  . Accelerated hypertension 12/15/2013  . Elevated cholesterol 12/15/2013    Class: History of    Sankalp Ferrell,CHRIS, PTA 12/30/2019, 5:48 PM  [facility] [address]Phone: [phone]  Fax:  [phone] Name: Gabrielle ADOLPHMRN: [id number] of Birth: 817-May-1967

## 2020-01-06 ENCOUNTER — Ambulatory Visit: Payer: BC Managed Care – PPO | Admitting: Physical Therapy

## 2020-01-07 ENCOUNTER — Encounter: Payer: BC Managed Care – PPO | Admitting: Physical Therapy

## 2020-01-11 ENCOUNTER — Ambulatory Visit: Payer: BC Managed Care – PPO | Admitting: Physical Therapy

## 2020-01-13 ENCOUNTER — Other Ambulatory Visit: Payer: Self-pay

## 2020-01-13 ENCOUNTER — Ambulatory Visit: Payer: BC Managed Care – PPO | Attending: Surgical | Admitting: Physical Therapy

## 2020-01-13 DIAGNOSIS — G8929 Other chronic pain: Secondary | ICD-10-CM | POA: Insufficient documentation

## 2020-01-13 DIAGNOSIS — M25512 Pain in left shoulder: Secondary | ICD-10-CM | POA: Insufficient documentation

## 2020-01-13 DIAGNOSIS — M6281 Muscle weakness (generalized): Secondary | ICD-10-CM

## 2020-01-13 DIAGNOSIS — M25511 Pain in right shoulder: Secondary | ICD-10-CM | POA: Insufficient documentation

## 2020-01-13 DIAGNOSIS — R293 Abnormal posture: Secondary | ICD-10-CM | POA: Diagnosis not present

## 2020-01-13 NOTE — Therapy (Addendum)
[facility] [place], [place], [address] Phone: [phone]   Fax:  [phone]  Physical Therapy Treatment  Patient Details  Name: Gabrielle Russell MRN: [id number] Date of Birth: [birth year] Referring Provider (PT): Grier Mitts, [place]   Encounter Date: 01/13/2020   PT End of Session - 01/13/20 1728     Visit Number 13    Number of Visits 18    Date for PT Re-Evaluation 01/07/20    Authorization Type FOTO initial: 37% limitation; Progress note every 10th visit    PT Start Time 0400    PT Stop Time 0453    PT Time Calculation (min) 53 min    Activity Tolerance Patient tolerated treatment well    Behavior During Therapy [facility] for tasks assessed/performed             Past Medical History:  Diagnosis Date   Amenorrhea    Anxiety    Depression    Diabetes mellitus without complication ([place])    Dyspareunia    Hypertension    Urinary incontinence     Past Surgical History:  Procedure Laterality Date   CARPAL TUNNEL RELEASE Right    CESAREAN SECTION     TUBAL LIGATION      There were no vitals filed for this visit.   Subjective Assessment - 01/13/20 1725     Subjective COVID-19 screen performed prior to patient entering clinic.  Stiffness.    Pertinent History HTN, DM    Limitations Lifting;House hold activities    Diagnostic tests x-ray: normal results    Patient Stated Goals improve movement, dress with less pain    Currently in Pain? Yes    Pain Onset More than a month ago                               [facility] Adult PT Treatment/Exercise - 01/13/20 0001       Shoulder Exercises: ROM/Strengthening   UBE (Upper Arm Bike) 60 RPM's x 8 minutes.      Modalities   Modalities Electrical engineer Stimulation Location Bilateral shoulder.    Electrical Stimulation Action Pre-mod.    Electrical Stimulation Parameters 80-150 Hz x 15 minutes.     Electrical Stimulation Goals Pain      Ultrasound   Ultrasound Location Bilateral shoulders    Ultrasound Parameters Combo e'stim/US at 1.50 W/CM2 x 8 minutes to each shoulder (16 minutes total).    Ultrasound Goals Pain      Manual Therapy   Manual Therapy Soft tissue mobilization    Soft tissue mobilization STW/M x 2 minutes to each shoulder posterior cuff region (4 minutes total).                    PT Education - 01/13/20 1729     Education Details Reviewed shoulder ER with red theraband.  Excellent technqiue.  Red theraband provided for home use.                 PT Long Term Goals - 12/07/19 1324       PT LONG TERM GOAL #1   Title Patient will be independent with HEP    Time 6    Period Weeks    Status Achieved      PT LONG TERM GOAL #2   Title Patient will report ability to perform  ADLs witih bilateral shoulder pain less than or equal to 2/10.    Time 6    Period Weeks    Status Achieved      PT LONG TERM GOAL #3   Title Patient will report ability to don/doff bra with bilateral shoulder pain less than or equal to 2/10.    Time 6    Period Weeks    Status Partially Met      PT LONG TERM GOAL #4   Title Patient will demonstrate 4/5 or greater bilateral shoulder MMT to improve stability during functional tasks.    Time 6    Period Weeks    Status On-going                   Plan - 01/13/20 1730     Clinical Impression Statement Patient did well today.  Provided patient with red theraband and emphasized bilateral shoulder ER.    Personal Factors and Comorbidities Age;Comorbidity 2    Comorbidities HTN, DM    Examination-Activity Limitations Carry;Reach Overhead    Stability/Clinical Decision Making Stable/Uncomplicated    PT Treatment/Interventions ADLs/Self Care Home Management;Cryotherapy;Electrical Stimulation;Iontophoresis 66m/ml Dexamethasone;Moist Heat;Ultrasound;Neuromuscular re-education;Balance training;Therapeutic  exercise;Therapeutic activities;Functional mobility training;Patient/family education;Vasopneumatic Device;Taping;Dry needling;Passive range of motion;Manual techniques    PT Next Visit Plan cont with POC for  postural exercises, gentle shoulder exercises and stretching, modalities PRN for pain relief.    PT Home Exercise Plan see patient education section    Consulted and Agree with Plan of Care Patient             Patient will benefit from skilled therapeutic intervention in order to improve the following deficits and impairments:  Decreased activity tolerance, Decreased strength, Decreased range of motion, Pain, Postural dysfunction, Impaired UE functional use  Visit Diagnosis: Chronic right shoulder pain  Chronic left shoulder pain  Muscle weakness (generalized)  Abnormal posture     Problem List Patient Active Problem List   Diagnosis Date Noted   Type II or unspecified type diabetes mellitus without mention of complication, not stated as uncontrolled 12/15/2013    Class: History of   Accelerated hypertension 12/15/2013   Elevated cholesterol 12/15/2013    Class: History of    Luis Nickles, CMaliMPT 01/13/2020, 5:34 PM  [facility] [address] [address]Phone: [phone]  Fax:  [phone] Name: Gabrielle GINTZMRN: [id number] of Birth: 8November 27, 1967 PHYSICAL THERAPY DISCHARGE SUMMARY  Visits from Start of Care: 13.  Current functional level related to goals / functional outcomes: See above.   Remaining deficits: See below.   Education / Equipment: HEP.   Patient agrees to discharge. Patient goals were partially met. Patient is being discharged due to being pleased with the current functional level.

## 2020-01-18 DIAGNOSIS — F322 Major depressive disorder, single episode, severe without psychotic features: Secondary | ICD-10-CM | POA: Diagnosis not present

## 2020-02-15 DIAGNOSIS — F322 Major depressive disorder, single episode, severe without psychotic features: Secondary | ICD-10-CM | POA: Diagnosis not present

## 2020-03-07 DIAGNOSIS — E1165 Type 2 diabetes mellitus with hyperglycemia: Secondary | ICD-10-CM | POA: Diagnosis not present

## 2020-03-13 DIAGNOSIS — E782 Mixed hyperlipidemia: Secondary | ICD-10-CM | POA: Diagnosis not present

## 2020-03-13 DIAGNOSIS — B351 Tinea unguium: Secondary | ICD-10-CM | POA: Diagnosis not present

## 2020-03-13 DIAGNOSIS — Z23 Encounter for immunization: Secondary | ICD-10-CM | POA: Diagnosis not present

## 2020-03-13 DIAGNOSIS — I10 Essential (primary) hypertension: Secondary | ICD-10-CM | POA: Diagnosis not present

## 2020-03-13 DIAGNOSIS — E1159 Type 2 diabetes mellitus with other circulatory complications: Secondary | ICD-10-CM | POA: Diagnosis not present

## 2020-03-15 DIAGNOSIS — I10 Essential (primary) hypertension: Secondary | ICD-10-CM | POA: Diagnosis not present

## 2020-03-15 DIAGNOSIS — E1159 Type 2 diabetes mellitus with other circulatory complications: Secondary | ICD-10-CM | POA: Diagnosis not present

## 2020-03-21 DIAGNOSIS — F322 Major depressive disorder, single episode, severe without psychotic features: Secondary | ICD-10-CM | POA: Diagnosis not present

## 2020-04-14 DIAGNOSIS — I10 Essential (primary) hypertension: Secondary | ICD-10-CM | POA: Diagnosis not present

## 2020-04-14 DIAGNOSIS — E1159 Type 2 diabetes mellitus with other circulatory complications: Secondary | ICD-10-CM | POA: Diagnosis not present

## 2020-09-20 ENCOUNTER — Other Ambulatory Visit: Payer: Self-pay | Admitting: Gastroenterology

## 2020-09-20 DIAGNOSIS — R1010 Upper abdominal pain, unspecified: Secondary | ICD-10-CM

## 2020-09-28 ENCOUNTER — Other Ambulatory Visit: Payer: Self-pay | Admitting: Obstetrics and Gynecology

## 2020-09-28 NOTE — Telephone Encounter (Signed)
Last annual exam on 5/21 Annual scheduled on 10/05/20 Last mammogram 5/21 ( not scheduled)

## 2020-10-04 NOTE — Progress Notes (Signed)
55 y.o. G29P1011 Married White or Caucasian Not Hispanic or Latino female here for annual exam. Patient states that she is still having vaginal dryness. She has vaginal estrogen, she doesn't always remember to use. She has vulvar dryness, very sensitive with wiping.  Sexually active without penetration, hasn't tried in 3-4 years secondary to fear of pain.   No vaginal bleeding.  She has mixed incontinence. Some weeks she doesn't leak, can leak 3 x a week. The urgency is worse than the stress. Can leak a lot. She drinks caffeine free coffee.   She has issues with constipation. BM every 2-4 days.   She is being evaluated for gallbladder disease and upper GI issues. She is having an upper endoscopy with her colonoscopy in 8/22.    Patient's last menstrual period was 06/09/2016 (exact date).          Sexually active: Yes.    The current method of family planning is tubal ligation.    Exercising: No.  The patient does not participate in regular exercise at present. Smoker:  no  Health Maintenance: Pap: 06/11/2016 Normal Hpv Neg   04/13/13 wnl History of abnormal Pap:  no MMG:  09/29/19 density B Bi-rads 1 Neg. Scheduled  BMD:   Never  Colonoscopy:2015 Polyps is aware she is due. Scheduled in 8/22 TDaP:  09/30/19 Gardasil: none    reports that she has quit smoking. She has never used smokeless tobacco. She reports that she does not drink alcohol and does not use drugs. Just rare ETOH. Education officer, museum. She has 2 sons and 2 daughter. One son is adopted, the girls are her stepdaughter.    Past Medical History:  Diagnosis Date  . Amenorrhea   . Anxiety   . Depression   . Diabetes mellitus without complication ([place])   . Dyspareunia   . Hypertension   . Urinary incontinence   Recent HgbA1c was 6.3%  Past Surgical History:  Procedure Laterality Date  . CARPAL TUNNEL RELEASE Right   . CESAREAN SECTION    . TUBAL LIGATION      Current Outpatient Medications  Medication Sig Dispense Refill   . amLODipine (NORVASC) 2.5 MG tablet Take 2.5 mg by mouth daily.    Marland Kitchen aspirin 81 MG chewable tablet 1 tablet    . BD PEN NEEDLE NANO U/F 32G X 4 MM MISC     . CRESTOR 10 MG tablet daily.    . Estradiol (VAGIFEM) 10 MCG TABS vaginal tablet Place 1 tablet (10 mcg total) vaginally 2 (two) times a week. 8 tablet 0  . finasteride (PROSCAR) 5 MG tablet Take 5 mg by mouth daily.    Marland Kitchen FLUoxetine HCl 60 MG TABS 1 capsule    . Insulin Glargine (BASAGLAR KWIKPEN) 100 UNIT/ML Inject into the skin daily.    . metFORMIN (GLUCOPHAGE) 1000 MG tablet Take 1,000 mg by mouth daily with breakfast.    . ONE TOUCH ULTRA TEST test strip     . OZEMPIC, 1 MG/DOSE, 4 MG/3ML SOPN Inject 1 mg into the skin once a week.    . pantoprazole (PROTONIX) 40 MG tablet Take 1 tablet by mouth every morning.    Marland Kitchen REXULTI 1 MG TABS tablet Take 1 mg by mouth at bedtime.    Marland Kitchen telmisartan-hydrochlorothiazide (MICARDIS HCT) 80-25 MG per tablet daily.    . traZODone (DESYREL) 150 MG tablet 1 tablet at bedtime.     No current facility-administered medications for this visit.    Family History  Problem Relation Age of Onset  . Diabetes Mother   . Hypertension Mother   . Stroke Mother   . Heart attack Mother   . Hypertension Brother   . Heart attack Brother     Review of Systems  All other systems reviewed and are negative.   Exam:   BP 122/64   Pulse 85   Ht 5\' 3"  (1.6 m)   Wt 173 lb 9.6 oz (78.7 kg)   LMP 06/09/2016 (Exact Date)   SpO2 98%   BMI 30.75 kg/m   Weight change: @WEIGHTCHANGE @ Height:   Height: 5\' 3"  (160 cm)  Ht Readings from Last 3 Encounters:  10/05/20 5\' 3"  (1.6 m)  09/30/19 5\' 3"  (1.6 m)  06/11/16 5\' 3"  (1.6 m)    General appearance: alert, cooperative and appears stated age Head: Normocephalic, without obvious abnormality, atraumatic Neck: no adenopathy, supple, symmetrical, trachea midline and thyroid normal to inspection and palpation Lungs: clear to auscultation  bilaterally Cardiovascular: regular rate and rhythm Breasts: normal appearance, no masses or tenderness, bilateral fibrocystic changes, symmetric Abdomen: soft, non-tender; non distended,  no masses,  no organomegaly Extremities: extremities normal, atraumatic, no cyanosis or edema Skin: Skin color, texture, turgor normal. No rashes or lesions Lymph nodes: Cervical, supraclavicular, and axillary nodes normal. No abnormal inguinal nodes palpated Neurologic: Grossly normal   Pelvic: External genitalia:  no lesions              Urethra:  normal appearing urethra with no masses, tenderness or lesions              Bartholins and Skenes: normal                 Vagina: atrophic appearing vagina with normal color and discharge, no lesions. Only able to insert 2 fingers part way.               Cervix: no lesions               Bimanual Exam:  Uterus:  normal size, contour, position, consistency, mobility, non-tender              Adnexa: no mass, fullness, tenderness               Rectovaginal: Confirms               Anus:  normal sphincter tone, no lesions  Gae Dry chaperoned for the exam.  1. Well woman exam Discussed breast self exam Discussed calcium and vit D intake Mammogram and colonoscopy are scheduled  2. Vaginal atrophy She hasn't been using the vagifem regularly, she will work on it. We discussed the possibility of increasing to 3 x a wee - Estradiol (VAGIFEM) 10 MCG TABS vaginal tablet; Place 1 tablet (10 mcg total) vaginally 2 (two) times a week.  Dispense: 24 tablet; Refill: 3 -If she attempts penetration discussed using lubrication and her controlling rate and depth of penetration -Discussed option of vaginal dilators  3. Screening for cervical cancer - Cytology - PAP  4. Urinary incontinence, mixed -Given information on bladder training - Ambulatory referral to Physical Therapy  5. Constipation, unspecified constipation type Discussed option of magnesium.  6.  Female dyspareunia - Estradiol (VAGIFEM) 10 MCG TABS vaginal tablet; Place 1 tablet (10 mcg total) vaginally 2 (two) times a week.  Dispense: 24 tablet; Refill: 3 -If she attempts penetration discussed using lubrication and her controlling rate and depth of penetration -Discussed option of vaginal dilators

## 2020-10-05 ENCOUNTER — Encounter: Payer: Self-pay | Admitting: Obstetrics and Gynecology

## 2020-10-05 ENCOUNTER — Other Ambulatory Visit (HOSPITAL_COMMUNITY)
Admission: RE | Admit: 2020-10-05 | Discharge: 2020-10-05 | Disposition: A | Discharge status: Home / Self Care | Payer: 59 | Source: Ambulatory Visit | Attending: Obstetrics and Gynecology | Admitting: Obstetrics and Gynecology

## 2020-10-05 ENCOUNTER — Ambulatory Visit (INDEPENDENT_AMBULATORY_CARE_PROVIDER_SITE_OTHER): Payer: 59 | Admitting: Obstetrics and Gynecology

## 2020-10-05 ENCOUNTER — Other Ambulatory Visit: Payer: Self-pay

## 2020-10-05 VITALS — BP 122/64 | HR 85 | Ht 63.0 in | Wt 173.6 lb

## 2020-10-05 DIAGNOSIS — G479 Sleep disorder, unspecified: Secondary | ICD-10-CM | POA: Insufficient documentation

## 2020-10-05 DIAGNOSIS — N941 Unspecified dyspareunia: Secondary | ICD-10-CM

## 2020-10-05 DIAGNOSIS — I10 Essential (primary) hypertension: Secondary | ICD-10-CM | POA: Insufficient documentation

## 2020-10-05 DIAGNOSIS — Z124 Encounter for screening for malignant neoplasm of cervix: Secondary | ICD-10-CM | POA: Diagnosis not present

## 2020-10-05 DIAGNOSIS — N3946 Mixed incontinence: Secondary | ICD-10-CM

## 2020-10-05 DIAGNOSIS — K5900 Constipation, unspecified: Secondary | ICD-10-CM

## 2020-10-05 DIAGNOSIS — L661 Lichen planopilaris: Secondary | ICD-10-CM | POA: Insufficient documentation

## 2020-10-05 DIAGNOSIS — N952 Postmenopausal atrophic vaginitis: Secondary | ICD-10-CM | POA: Diagnosis not present

## 2020-10-05 DIAGNOSIS — F3340 Major depressive disorder, recurrent, in remission, unspecified: Secondary | ICD-10-CM | POA: Insufficient documentation

## 2020-10-05 DIAGNOSIS — E1165 Type 2 diabetes mellitus with hyperglycemia: Secondary | ICD-10-CM | POA: Insufficient documentation

## 2020-10-05 DIAGNOSIS — Z01419 Encounter for gynecological examination (general) (routine) without abnormal findings: Secondary | ICD-10-CM

## 2020-10-05 DIAGNOSIS — E559 Vitamin D deficiency, unspecified: Secondary | ICD-10-CM | POA: Insufficient documentation

## 2020-10-05 DIAGNOSIS — E282 Polycystic ovarian syndrome: Secondary | ICD-10-CM | POA: Insufficient documentation

## 2020-10-05 MED ORDER — ESTRADIOL 10 MCG VA TABS: 10.0000 ug | ORAL_TABLET | VAGINAL | 3 refills | Status: DC

## 2020-10-05 NOTE — Patient Instructions (Addendum)
Try Uberlube for lubrication You can use replens vaginal moisturizer as needed Vaseline or coconut oil externally You can take up to 500 mg a day of magnesium for constipation  EXERCISE   We recommended that you start or continue a regular exercise program for good health. Physical activity is anything that gets your body moving, some is better than none. The CDC recommends 150 minutes per week of Moderate-Intensity Aerobic Activity and 2 or more days of Muscle Strengthening Activity.  Benefits of exercise are limitless: helps weight loss/weight maintenance, improves mood and energy, helps with depression and anxiety, improves sleep, tones and strengthens muscles, improves balance, improves bone density, protects from chronic conditions such as heart disease, high blood pressure and diabetes and so much more. To learn more visit: WhyNotPoker.uy  DIET: Good nutrition starts with a healthy diet of fruits, vegetables, whole grains, and lean protein sources. Drink plenty of water for hydration. Minimize empty calories, sodium, sweets. For more information about dietary recommendations visit: GeekRegister.com.ee and http://schaefer-mitchell.com/  ALCOHOL:  Women should limit their alcohol intake to no more than 7 drinks/beers/glasses of wine (combined, not each!) per week. Moderation of alcohol intake to this level decreases your risk of breast cancer and liver damage.  If you are concerned that you may have a problem, or your friends have told you they are concerned about your drinking, there are many resources to help. A well-known program that is free, effective, and available to all people all over the nation is Alcoholics Anonymous.  Check out this site to learn more: BlockTaxes.se   CALCIUM AND VITAMIN D:  Adequate intake of calcium and Vitamin D are recommended for bone health.  You should be getting between  1000-1200 mg of calcium and 800 units of Vitamin D daily between diet and supplements  PAP SMEARS:  Pap smears, to check for cervical cancer or precancers,  have traditionally been done yearly, scientific advances have shown that most women can have pap smears less often.  However, every woman still should have a physical exam from her gynecologist every year. It will include a breast check, inspection of the vulva and vagina to check for abnormal growths or skin changes, a visual exam of the cervix, and then an exam to evaluate the size and shape of the uterus and ovaries. We will also provide age appropriate advice regarding health maintenance, like when you should have certain vaccines, screening for sexually transmitted diseases, bone density testing, colonoscopy, mammograms, etc.   MAMMOGRAMS:  All women over 1 years old should have a routine mammogram.   COLON CANCER SCREENING: Now recommend starting at age 40. At this time colonoscopy is not covered for routine screening until 50. There are take home tests that can be done between 45-49.   COLONOSCOPY:  Colonoscopy to screen for colon cancer is recommended for all women at age 57.  We know, you hate the idea of the prep.  We agree, BUT, having colon cancer and not knowing it is worse!!  Colon cancer so often starts as a polyp that can be seen and removed at colonscopy, which can quite literally save your life!  And if your first colonoscopy is normal and you have no family history of colon cancer, most women don't have to have it again for 10 years.  Once every ten years, you can do something that may end up saving your life, right?  We will be happy to help you get it scheduled when you are ready.  Be sure to check your insurance coverage so you understand how much it will cost.  It may be covered as a preventative service at no cost, but you should check your particular policy.      Breast Self-Awareness Breast self-awareness means being  familiar with how your breasts look and feel. It involves checking your breasts regularly and reporting any changes to your health care provider. Practicing breast self-awareness is important. A change in your breasts can be a sign of a serious medical problem. Being familiar with how your breasts look and feel allows you to find any problems early, when treatment is more likely to be successful. All women should practice breast self-awareness, including women who have had breast implants. How to do a breast self-exam One way to learn what is normal for your breasts and whether your breasts are changing is to do a breast self-exam. To do a breast self-exam: Look for Changes  1. Remove all the clothing above your waist. 2. Stand in front of a mirror in a room with good lighting. 3. Put your hands on your hips. 4. Push your hands firmly downward. 5. Compare your breasts in the mirror. Look for differences between them (asymmetry), such as: ? Differences in shape. ? Differences in size. ? Puckers, dips, and bumps in one breast and not the other. 6. Look at each breast for changes in your skin, such as: ? Redness. ? Scaly areas. 7. Look for changes in your nipples, such as: ? Discharge. ? Bleeding. ? Dimpling. ? Redness. ? A change in position. Feel for Changes Carefully feel your breasts for lumps and changes. It is best to do this while lying on your back on the floor and again while sitting or standing in the shower or tub with soapy water on your skin. Feel each breast in the following way:  Place the arm on the side of the breast you are examining above your head.  Feel your breast with the other hand.  Start in the nipple area and make  inch (2 cm) overlapping circles to feel your breast. Use the pads of your three middle fingers to do this. Apply light pressure, then medium pressure, then firm pressure. The light pressure will allow you to feel the tissue closest to the skin. The  medium pressure will allow you to feel the tissue that is a little deeper. The firm pressure will allow you to feel the tissue close to the ribs.  Continue the overlapping circles, moving downward over the breast until you feel your ribs below your breast.  Move one finger-width toward the center of the body. Continue to use the  inch (2 cm) overlapping circles to feel your breast as you move slowly up toward your collarbone.  Continue the up and down exam using all three pressures until you reach your armpit.  Write Down What You Find  Write down what is normal for each breast and any changes that you find. Keep a written record with breast changes or normal findings for each breast. By writing this information down, you do not need to depend only on memory for size, tenderness, or location. Write down where you are in your menstrual cycle, if you are still menstruating. If you are having trouble noticing differences in your breasts, do not get discouraged. With time you will become more familiar with the variations in your breasts and more comfortable with the exam. How often should I examine my breasts? Examine your  breasts every month. If you are breastfeeding, the best time to examine your breasts is after a feeding or after using a breast pump. If you menstruate, the best time to examine your breasts is 5-7 days after your period is over. During your period, your breasts are lumpier, and it may be more difficult to notice changes. When should I see my health care provider? See your health care provider if you notice:  A change in shape or size of your breasts or nipples.  A change in the skin of your breast or nipples, such as a reddened or scaly area.  Unusual discharge from your nipples.  A lump or thick area that was not there before.  Pain in your breasts.  Anything that concerns you.  About Constipation  Constipation Overview Constipation is the most common  gastrointestinal complaint -- about 4 million Americans experience constipation and make 2.5 million physician visits a year to get help for the problem.  Constipation can occur when the colon absorbs too much water, the colon's muscle contraction is slow or sluggish, and/or there is delayed transit time through the colon.  The result is stool that is hard and dry.  Indicators of constipation include straining during bowel movements greater than 25% of the time, having fewer than three bowel movements per week, and/or the feeling of incomplete evacuation.  There are established guidelines ([place] II ) for defining constipation. A person needs to have two or more of the following symptoms for at least 12 weeks (not necessarily consecutive) in the preceding 12 months: . Straining in  greater than 25% of bowel movements . Lumpy or hard stools in greater than 25% of bowel movements . Sensation of incomplete emptying in greater than 25% of bowel movements . Sensation of anorectal obstruction/blockade in greater than 25% of bowel movements . Manual maneuvers to help empty greater than 25% of bowel movements (e.g., digital evacuation, support of the pelvic floor)  . Less than  3 bowel movements/week . Loose stools are not present, and criteria for irritable bowel syndrome are insufficient  Common Causes of Constipation . Lack of fiber in your diet . Lack of physical activity . Medications, including iron and calcium supplements  . Dairy intake . Dehydration . Abuse of laxatives  Travel  Irritable Bowel Syndrome  Pregnancy  Luteal phase of menstruation (after ovulation and before menses)  Colorectal problems  Intestinal Dysfunction  Treating Constipation  There are several ways of treating constipation, including changes to diet and exercise, use of laxatives, adjustments to the pelvic floor, and scheduled toileting.  These treatments include: . increasing fiber and fluids in the diet   . increasing physical activity . learning muscle coordination   learning proper toileting techniques and toileting modifications   designing and sticking  to a toileting schedule     2007, [employer] Doc.22

## 2020-10-06 LAB — CYTOLOGY - PAP
Comment: NEGATIVE
Diagnosis: NEGATIVE
High risk HPV: NEGATIVE

## 2020-10-12 ENCOUNTER — Other Ambulatory Visit: Payer: Self-pay | Admitting: Family Medicine

## 2020-10-12 DIAGNOSIS — Z1231 Encounter for screening mammogram for malignant neoplasm of breast: Secondary | ICD-10-CM

## 2020-10-13 ENCOUNTER — Telehealth: Payer: Self-pay | Admitting: *Deleted

## 2020-10-13 NOTE — Telephone Encounter (Signed)
-----   Message from Salvadore Dom, MD sent at 10/05/2020 11:59 AM EDT ----- Referral placed for pelvic floor PT at [facility].  Thanks, Sharee Pimple

## 2020-10-13 NOTE — Telephone Encounter (Signed)
Office notes faxed to [facility] they will call to schedule. 

## 2020-10-19 ENCOUNTER — Other Ambulatory Visit: Payer: Self-pay | Admitting: Gastroenterology

## 2020-10-19 DIAGNOSIS — R1013 Epigastric pain: Secondary | ICD-10-CM

## 2020-10-24 ENCOUNTER — Ambulatory Visit
Admission: RE | Admit: 2020-10-24 | Discharge: 2020-10-24 | Disposition: A | Discharge status: Home / Self Care | Payer: 59 | Source: Ambulatory Visit | Attending: Gastroenterology | Admitting: Gastroenterology

## 2020-10-24 DIAGNOSIS — R1013 Epigastric pain: Secondary | ICD-10-CM

## 2020-10-25 NOTE — Telephone Encounter (Signed)
[facility] called and left message x1 on 10/19/20, patient has not called back.

## 2020-12-07 ENCOUNTER — Ambulatory Visit: Payer: 59

## 2021-01-26 ENCOUNTER — Ambulatory Visit: Payer: 59

## 2021-05-08 ENCOUNTER — Other Ambulatory Visit: Payer: Self-pay | Admitting: Family Medicine

## 2021-05-08 DIAGNOSIS — Z1231 Encounter for screening mammogram for malignant neoplasm of breast: Secondary | ICD-10-CM

## 2021-05-31 ENCOUNTER — Ambulatory Visit: Payer: 59

## 2021-07-14 IMAGING — MG DIGITAL SCREENING BILAT W/ TOMO W/ CAD
6 of 10 series · 6 of 30 positions shown · non-contrast
Comparison: Previous exam(s).

CLINICAL DATA: Screening.

EXAM:
DIGITAL SCREENING BILATERAL MAMMOGRAM WITH TOMO AND CAD

[L MLO synth-2D]
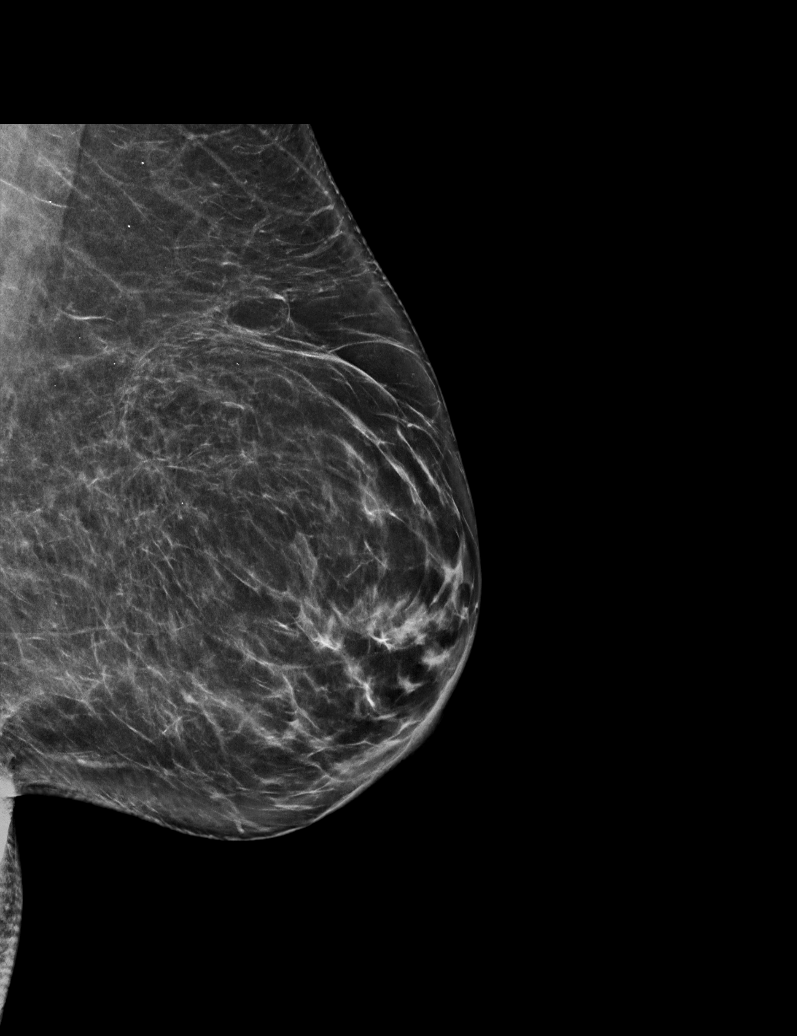

[L CC synth-2D]
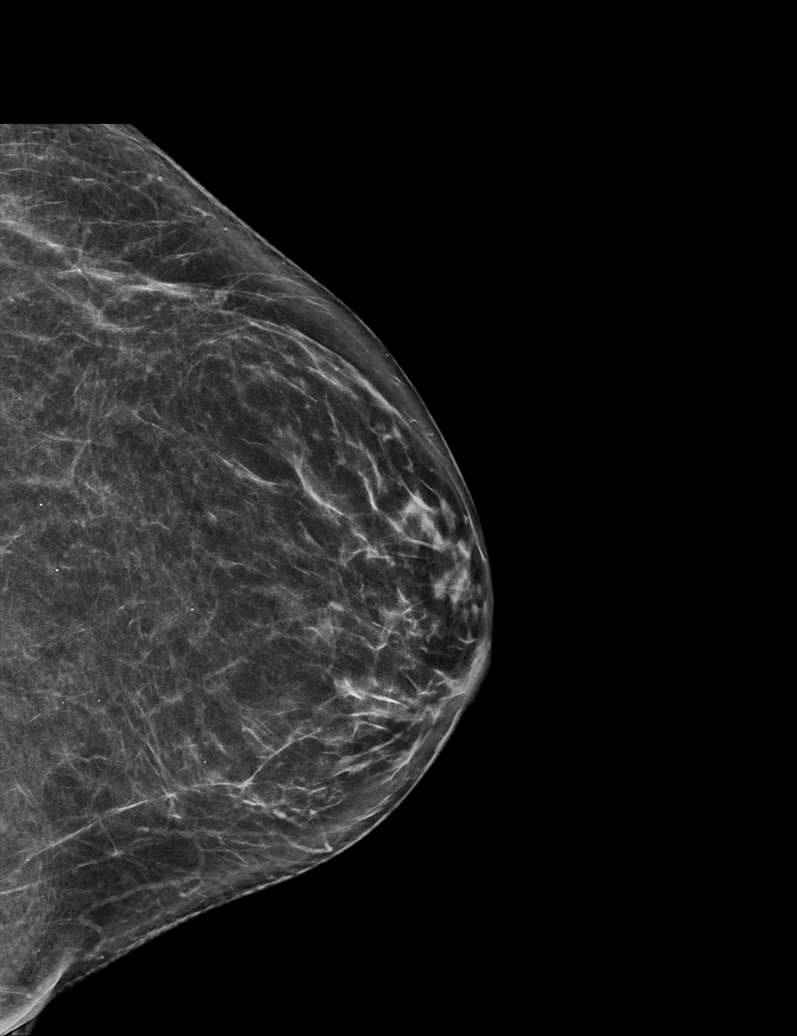

[R CV synth-2D]
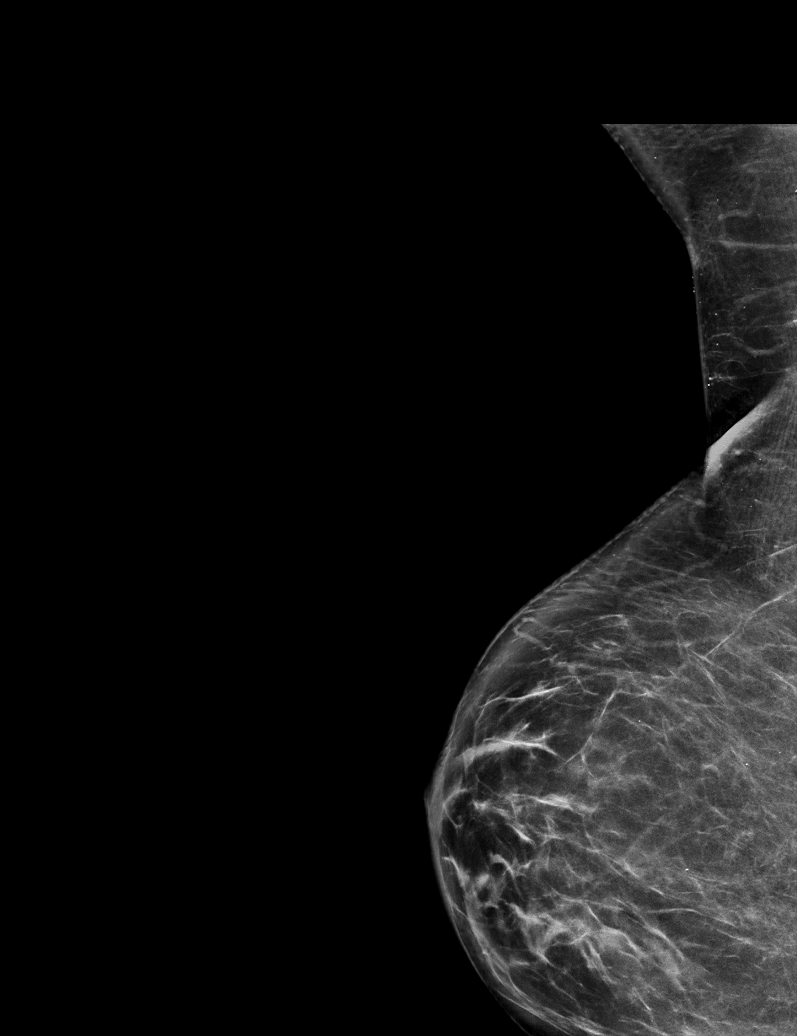

[R CC synth-2D]
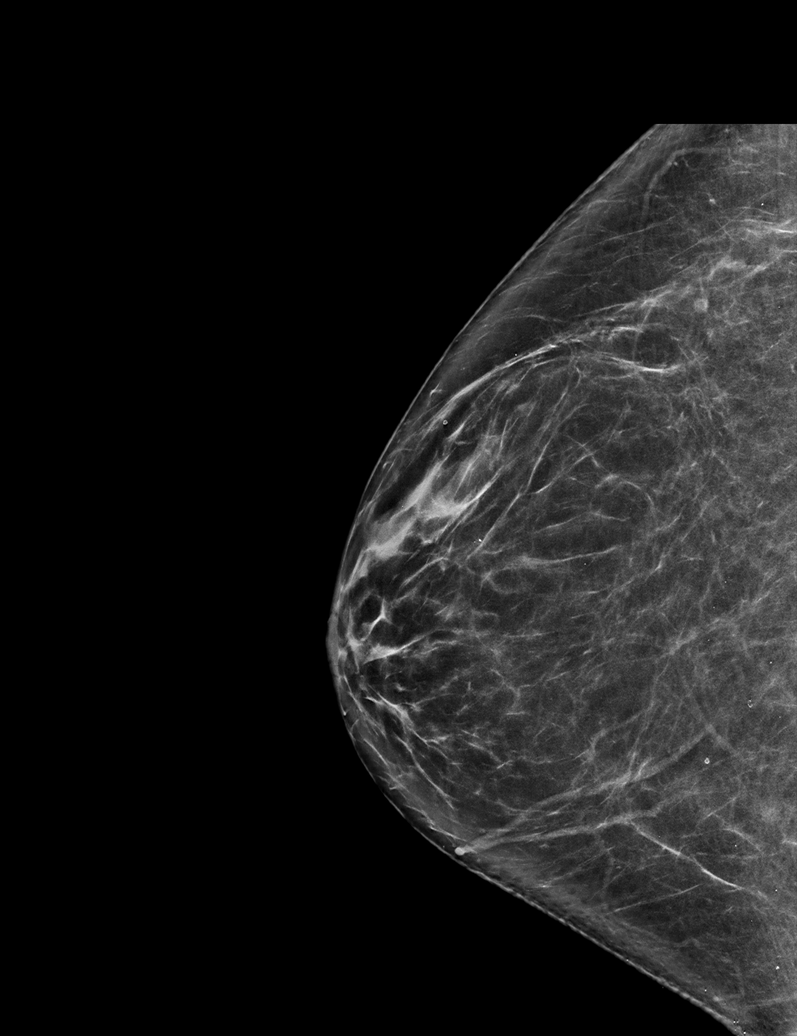

[R MLO synth-2D]
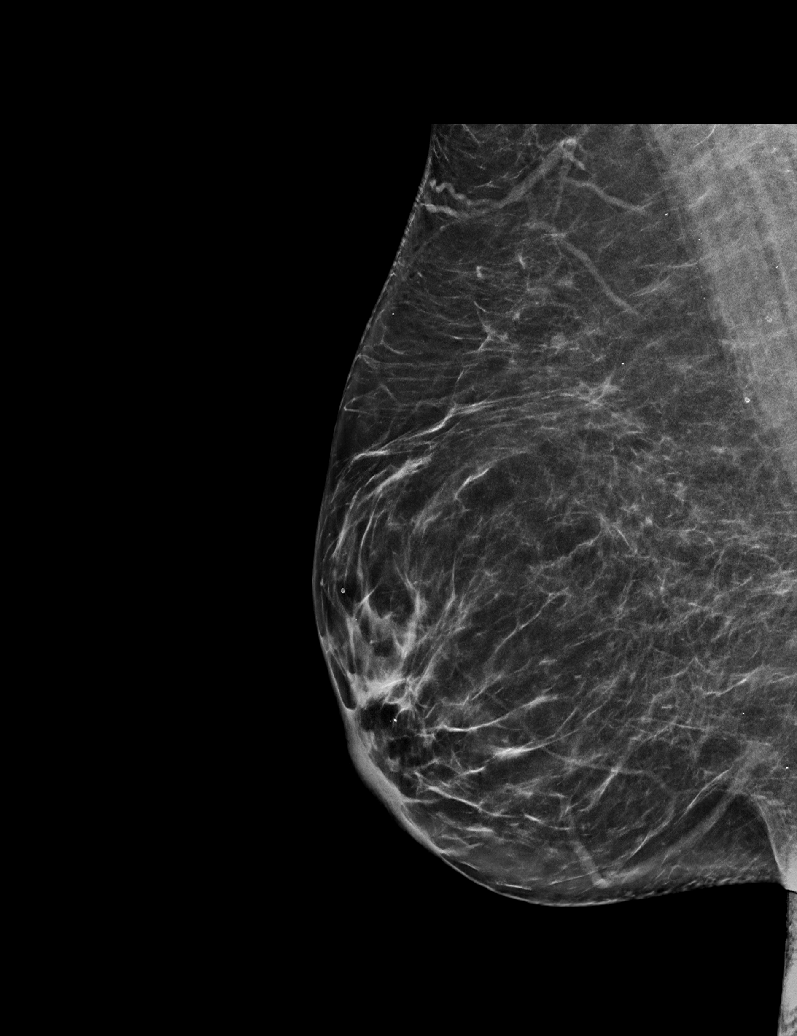

[L MLO tomo · tomo slice 36/71.0]
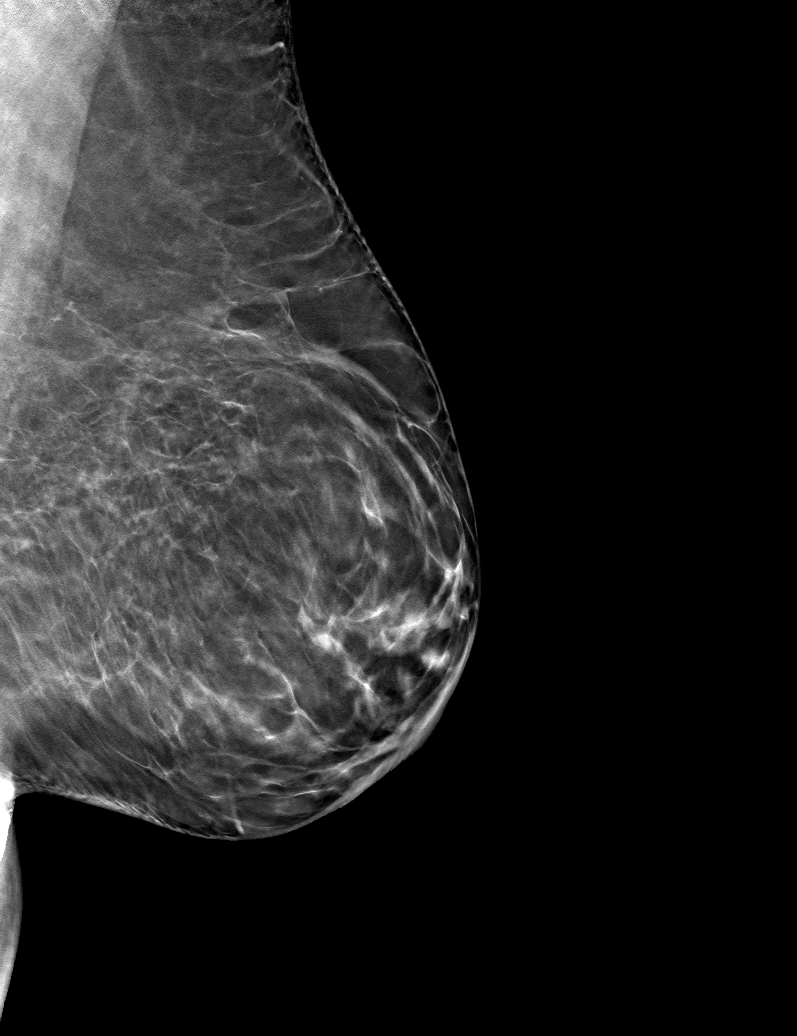

[6 of 30 positions shown; findings below may reference images not displayed]

ACR Breast Density Category b: There are scattered areas of
fibroglandular density.
FINDINGS: There are no findings suspicious for malignancy. Images were
processed with CAD.
IMPRESSION: No mammographic evidence of malignancy. A result letter of this
screening mammogram will be mailed directly to the patient.

RECOMMENDATION:
Screening mammogram in one year. (Code:[id number])

BI-RADS CATEGORY  1: Negative.

## 2021-07-23 ENCOUNTER — Emergency Department (HOSPITAL_BASED_OUTPATIENT_CLINIC_OR_DEPARTMENT_OTHER): Payer: 59

## 2021-07-23 ENCOUNTER — Encounter (HOSPITAL_BASED_OUTPATIENT_CLINIC_OR_DEPARTMENT_OTHER): Payer: Self-pay | Admitting: Pediatrics

## 2021-07-23 ENCOUNTER — Other Ambulatory Visit: Payer: Self-pay

## 2021-07-23 ENCOUNTER — Emergency Department (HOSPITAL_BASED_OUTPATIENT_CLINIC_OR_DEPARTMENT_OTHER)
Admission: EM | Admit: 2021-07-23 | Discharge: 2021-07-24 | Disposition: A | Discharge status: Home / Self Care | Payer: 59 | Attending: Emergency Medicine | Admitting: Emergency Medicine

## 2021-07-23 DIAGNOSIS — Z79899 Other long term (current) drug therapy: Secondary | ICD-10-CM | POA: Diagnosis not present

## 2021-07-23 DIAGNOSIS — G43409 Hemiplegic migraine, not intractable, without status migrainosus: Secondary | ICD-10-CM | POA: Diagnosis not present

## 2021-07-23 DIAGNOSIS — Z87891 Personal history of nicotine dependence: Secondary | ICD-10-CM | POA: Insufficient documentation

## 2021-07-23 DIAGNOSIS — R519 Headache, unspecified: Secondary | ICD-10-CM | POA: Diagnosis present

## 2021-07-23 DIAGNOSIS — E119 Type 2 diabetes mellitus without complications: Secondary | ICD-10-CM | POA: Insufficient documentation

## 2021-07-23 DIAGNOSIS — Z794 Long term (current) use of insulin: Secondary | ICD-10-CM | POA: Diagnosis not present

## 2021-07-23 DIAGNOSIS — I10 Essential (primary) hypertension: Secondary | ICD-10-CM | POA: Insufficient documentation

## 2021-07-23 HISTORY — DX: Type 2 diabetes mellitus without complications: E11.9

## 2021-07-23 LAB — COMPREHENSIVE METABOLIC PANEL
ALT: 13 U/L (ref 0–44)
AST: 15 U/L (ref 15–41)
Albumin: 4.7 g/dL (ref 3.5–5.0)
Alkaline Phosphatase: 53 U/L (ref 38–126)
Anion gap: 13 (ref 5–15)
BUN: 9 mg/dL (ref 6–20)
CO2: 27 mmol/L (ref 22–32)
Calcium: 9.9 mg/dL (ref 8.9–10.3)
Chloride: 94 mmol/L — ABNORMAL LOW (ref 98–111)
Creatinine, Ser: 0.69 mg/dL (ref 0.44–1.00)
GFR, Estimated: >60 mL/min (ref >60)
Glucose, Bld: 225 mg/dL — ABNORMAL HIGH (ref 70–99)
Potassium: 3.4 mmol/L — ABNORMAL LOW (ref 3.5–5.1)
Sodium: 134 mmol/L — ABNORMAL LOW (ref 135–145)
Total Bilirubin: 0.8 mg/dL (ref 0.3–1.2)
Total Protein: 8.1 g/dL (ref 6.5–8.1)

## 2021-07-23 LAB — CBC WITH DIFFERENTIAL/PLATELET
Abs Immature Granulocytes: 0.05 10*3/uL (ref 0.00–0.07)
Basophils Absolute: 0.1 10*3/uL (ref 0.0–0.1)
Basophils Relative: 1 %
Eosinophils Absolute: 0 10*3/uL (ref 0.0–0.5)
Eosinophils Relative: 0 %
HCT: 41 % (ref 36.0–46.0)
Hemoglobin: 14 g/dL (ref 12.0–15.0)
Immature Granulocytes: 0 %
Lymphocytes Relative: 16 %
Lymphs Abs: 2 10*3/uL (ref 0.7–4.0)
MCH: 29.9 pg (ref 26.0–34.0)
MCHC: 34.1 g/dL (ref 30.0–36.0)
MCV: 87.4 fL (ref 80.0–100.0)
Monocytes Absolute: 0.3 10*3/uL (ref 0.1–1.0)
Monocytes Relative: 3 %
Neutro Abs: 9.9 10*3/uL — ABNORMAL HIGH (ref 1.7–7.7)
Neutrophils Relative %: 80 %
Platelets: 274 10*3/uL (ref 150–400)
RBC: 4.69 MIL/uL (ref 3.87–5.11)
RDW: 12.6 % (ref 11.5–15.5)
WBC: 12.4 10*3/uL — ABNORMAL HIGH (ref 4.0–10.5)
nRBC: 0 % (ref 0.0–0.2)

## 2021-07-23 LAB — TROPONIN I (HIGH SENSITIVITY): Troponin I (High Sensitivity): 3 ng/L (ref <18)

## 2021-07-23 NOTE — ED Triage Notes (Signed)
C/O headache upon waking up this morning, vomiting and high blood pressure.  ?

## 2021-07-24 ENCOUNTER — Encounter (HOSPITAL_BASED_OUTPATIENT_CLINIC_OR_DEPARTMENT_OTHER): Payer: Self-pay | Admitting: Emergency Medicine

## 2021-07-24 LAB — URINALYSIS, ROUTINE W REFLEX MICROSCOPIC
Bilirubin Urine: NEGATIVE
Glucose, UA: >1000 mg/dL — AB
Hgb urine dipstick: NEGATIVE
Ketones, ur: 40 mg/dL — AB
Leukocytes,Ua: NEGATIVE
Nitrite: NEGATIVE
Protein, ur: 30 mg/dL — AB
Specific Gravity, Urine: 1.019 (ref 1.005–1.030)
pH: 7.5 (ref 5.0–8.0)

## 2021-07-24 LAB — TROPONIN I (HIGH SENSITIVITY): Troponin I (High Sensitivity): 5 ng/L (ref <18)

## 2021-07-24 MED ORDER — METOCLOPRAMIDE HCL 5 MG/ML IJ SOLN
10.0000 mg | Freq: Once | INTRAMUSCULAR | Status: AC
  Administered 2021-07-24: 10 mg via INTRAVENOUS
  Filled 2021-07-24: qty 2

## 2021-07-24 MED ORDER — KETOROLAC TROMETHAMINE 15 MG/ML IJ SOLN
15.0000 mg | Freq: Once | INTRAMUSCULAR | Status: AC
  Administered 2021-07-24: 15 mg via INTRAVENOUS
  Filled 2021-07-24: qty 1

## 2021-07-24 MED ORDER — DIPHENHYDRAMINE HCL 50 MG/ML IJ SOLN
25.0000 mg | Freq: Once | INTRAMUSCULAR | Status: AC
  Administered 2021-07-24: 25 mg via INTRAVENOUS
  Filled 2021-07-24: qty 1

## 2021-07-24 MED ORDER — SODIUM CHLORIDE 0.9 % IV BOLUS
1000.0000 mL | Freq: Once | INTRAVENOUS | Status: AC
  Administered 2021-07-24: 1000 mL via INTRAVENOUS

## 2021-07-24 NOTE — ED Provider Notes (Signed)
? ?[facility] ?Provider Note: Georgena Spurling, MD, [facility] ? ?CSN: [id number] ?MRN: [id number] ?ARRIVAL: 07/23/21 at 2008 ?ROOM: [id number]/[id number] ? ? ?CHIEF COMPLAINT  ?Headache ? ? ?HISTORY OF PRESENT ILLNESS  ?07/24/21 1:05 AM ?Gabrielle Russell is a 56 y.o. female who woke up yesterday morning with a headache.  She does not have a history of frequent headaches.  Her headache is bifrontal but more prominent on the left.  It is throbbing in nature and she rates it as an 8 out of 10.  She has had associated nausea and vomiting.  She did not initially have photophobia but has developed photophobia in the last few hours.  She has also noticed her blood pressure was.  It was 202/88 at home.  It is currently 175/69.  She denies any visual changes, numbness or weakness. ? ? ?Past Medical History:  ?Diagnosis Date  ? Amenorrhea   ? Anxiety   ? Depression   ? Diabetes mellitus ([place])   ? Dyspareunia   ? Hypertension   ? Urinary incontinence   ? ? ?Past Surgical History:  ?Procedure Laterality Date  ? CARPAL TUNNEL RELEASE Right   ? CESAREAN SECTION    ? TUBAL LIGATION    ? ? ?Family History  ?Problem Relation Age of Onset  ? Diabetes Mother   ? Hypertension Mother   ? Stroke Mother   ? Heart attack Mother   ? Hypertension Brother   ? Heart attack Brother   ? ? ?Social History  ? ?Tobacco Use  ? Smoking status: Former  ? Smokeless tobacco: Never  ?Substance Use Topics  ? Alcohol use: No  ? Drug use: No  ? ? ?Prior to Admission medications   ?Medication Sig Start Date End Date Taking? Authorizing Provider  ?amLODipine (NORVASC) 2.5 MG tablet Take 2.5 mg by mouth daily.    [provider]  ?aspirin 81 MG chewable tablet 1 tablet    [provider]  ?BD PEN NEEDLE NANO U/F 32G X 4 MM MISC  05/31/16   [provider]  ?CRESTOR 10 MG tablet daily. 11/18/13   [provider]  ?Estradiol (VAGIFEM) 10 MCG TABS vaginal tablet Place 1 tablet (10 mcg total) vaginally 2 (two) times a week. 10/05/20   Salvadore Dom, MD  ?finasteride (PROSCAR) 5 MG tablet Take 5 mg by mouth daily. 07/21/19   [provider]  ?FLUoxetine HCl 60 MG TABS 1 capsule    [provider]  ?Insulin Glargine (BASAGLAR KWIKPEN) 100 UNIT/ML Inject into the skin daily.    [provider]  ?metFORMIN (GLUCOPHAGE) 1000 MG tablet Take 1,000 mg by mouth daily with breakfast. 07/12/19   [provider]  ?ONE TOUCH ULTRA TEST test strip  11/03/13   [provider]  ?OZEMPIC, 1 MG/DOSE, 4 MG/3ML SOPN Inject 1 mg into the skin once a week. 07/09/20   [provider]  ?pantoprazole (PROTONIX) 40 MG tablet Take 1 tablet by mouth every morning. 09/20/20   [provider]  ?REXULTI 1 MG TABS tablet Take 1 mg by mouth at bedtime. 09/17/20   [provider]  ?telmisartan-hydrochlorothiazide (MICARDIS HCT) 80-25 MG per tablet daily. 12/09/13   [provider]  ?traZODone (DESYREL) 150 MG tablet 1 tablet at bedtime.    [provider]  ? ? ?Allergies ?Patient has no known allergies. ? ? ?REVIEW OF SYSTEMS  ?Negative except as noted here or in the History of Present Illness. ? ? ?PHYSICAL  EXAMINATION  ?Initial Vital Signs ?Blood pressure (!) 175/69, pulse 65, temperature 97.9 ?F (36.6 ?C), resp. rate 19, height '5\' 3"'$  (1.6 m), weight 83.9 kg, last menstrual period 06/09/2016, SpO2 91 %. ? ?Examination ?General: Well-developed, well-nourished female in no acute distress; appearance consistent with age of record ?HENT: normocephalic; atraumatic ?Eyes: pupils equal, round and reactive to light; extraocular muscles intact ?Neck: supple ?Heart: regular rate and rhythm ?Lungs: clear to auscultation bilaterally ?Abdomen: soft; nondistended; nontender; bowel sounds present ?Extremities: No deformity; full range of motion; pulses normal ?Neurologic: Awake, alert and oriented; motor function intact in all extremities and symmetric; no facial droop ?Skin: Warm and dry ?Psychiatric: Normal mood  and affect ? ? ?RESULTS  ?Summary of this visit's results, reviewed and interpreted by myself: ? ? EKG Interpretation ? ?Date/Time:    ?Ventricular Rate:    ?PR Interval:    ?QRS Duration:   ?QT Interval:    ?QTC Calculation:   ?R Axis:     ?Text Interpretation:   ?  ? ?  ? ?Laboratory Studies: ?Results for orders placed or performed during the hospital encounter of 07/23/21 (from the past 24 hour(s))  ?CBC with Differential     Status: Abnormal  ? Collection Time: 07/23/21  8:35 PM  ?Result Value Ref Range  ? WBC 12.4 (H) 4.0 - 10.5 K/uL  ? RBC 4.69 3.87 - 5.11 MIL/uL  ? Hemoglobin 14.0 12.0 - 15.0 g/dL  ? HCT 41.0 36.0 - 46.0 %  ? MCV 87.4 80.0 - 100.0 fL  ? MCH 29.9 26.0 - 34.0 pg  ? MCHC 34.1 30.0 - 36.0 g/dL  ? RDW 12.6 11.5 - 15.5 %  ? Platelets 274 150 - 400 K/uL  ? nRBC 0.0 0.0 - 0.2 %  ? Neutrophils Relative % 80 %  ? Neutro Abs 9.9 (H) 1.7 - 7.7 K/uL  ? Lymphocytes Relative 16 %  ? Lymphs Abs 2.0 0.7 - 4.0 K/uL  ? Monocytes Relative 3 %  ? Monocytes Absolute 0.3 0.1 - 1.0 K/uL  ? Eosinophils Relative 0 %  ? Eosinophils Absolute 0.0 0.0 - 0.5 K/uL  ? Basophils Relative 1 %  ? Basophils Absolute 0.1 0.0 - 0.1 K/uL  ? Immature Granulocytes 0 %  ? Abs Immature Granulocytes 0.05 0.00 - 0.07 K/uL  ?Comprehensive metabolic panel     Status: Abnormal  ? Collection Time: 07/23/21  8:35 PM  ?Result Value Ref Range  ? Sodium 134 (L) 135 - 145 mmol/L  ? Potassium 3.4 (L) 3.5 - 5.1 mmol/L  ? Chloride 94 (L) 98 - 111 mmol/L  ? CO2 27 22 - 32 mmol/L  ? Glucose, Bld 225 (H) 70 - 99 mg/dL  ? BUN 9 6 - 20 mg/dL  ? Creatinine, Ser 0.69 0.44 - 1.00 mg/dL  ? Calcium 9.9 8.9 - 10.3 mg/dL  ? Total Protein 8.1 6.5 - 8.1 g/dL  ? Albumin 4.7 3.5 - 5.0 g/dL  ? AST 15 15 - 41 U/L  ? ALT 13 0 - 44 U/L  ? Alkaline Phosphatase 53 38 - 126 U/L  ? Total Bilirubin 0.8 0.3 - 1.2 mg/dL  ? GFR, Estimated >60 >60 mL/min  ? Anion gap 13 5 - 15  ?Troponin I (High Sensitivity)     Status: None  ? Collection Time: 07/23/21  8:35 PM  ?Result Value  Ref Range  ? Troponin I (High Sensitivity) 3 <18 ng/L  ?Troponin I (High Sensitivity)     Status: None  ?  Collection Time: 07/24/21 12:14 AM  ?Result Value Ref Range  ? Troponin I (High Sensitivity) 5 <18 ng/L  ?Urinalysis, Routine w reflex microscopic     Status: Abnormal  ? Collection Time: 07/24/21  1:48 AM  ?Result Value Ref Range  ? Color, Urine YELLOW YELLOW  ? APPearance CLEAR CLEAR  ? Specific Gravity, Urine 1.019 1.005 - 1.030  ? pH 7.5 5.0 - 8.0  ? Glucose, UA >1,000 (A) NEGATIVE mg/dL  ? Hgb urine dipstick NEGATIVE NEGATIVE  ? Bilirubin Urine NEGATIVE NEGATIVE  ? Ketones, ur 40 (A) NEGATIVE mg/dL  ? Protein, ur 30 (A) NEGATIVE mg/dL  ? Nitrite NEGATIVE NEGATIVE  ? Leukocytes,Ua NEGATIVE NEGATIVE  ? RBC / HPF 0-5 0 - 5 RBC/hpf  ? WBC, UA 0-5 0 - 5 WBC/hpf  ? Squamous Epithelial / LPF 0-5 0 - 5  ? Mucus PRESENT   ? ?Imaging Studies: ?CT Head Wo Contrast ? ?Result Date: 07/23/2021 ?CLINICAL DATA:  Headache.  Hypertensive emergency.  Vomiting. EXAM: CT HEAD WITHOUT CONTRAST TECHNIQUE: Contiguous axial images were obtained from the base of the skull through the vertex without intravenous contrast. RADIATION DOSE REDUCTION: This exam was performed according to the departmental dose-optimization program which includes automated exposure control, adjustment of the mA and/or kV according to patient size and/or use of iterative reconstruction technique. COMPARISON:  None. FINDINGS: Brain: No acute intracranial abnormality. Specifically, no hemorrhage, hydrocephalus, mass lesion, acute infarction, or significant intracranial injury. Vascular: No hyperdense vessel or unexpected calcification. Skull: No acute calvarial abnormality. Sinuses/Orbits: No acute findings Other: None IMPRESSION: Normal study. Electronically Signed   By: Rolm Baptise M.D.   On: 07/23/2021 21:12   ? ?ED COURSE and MDM  ?Nursing notes, initial and subsequent vitals signs, including pulse oximetry, reviewed and interpreted by myself. ? ?Vitals:   ? 07/23/21 2342 07/24/21 0000 07/24/21 0100 07/24/21 0149  ?BP: (!) 184/75 (!) 181/68 (!) 175/69 (!) 181/83  ?Pulse: 69 63 65 85  ?Resp: '18 19 19 15  '$ ?Temp:      ?SpO2: 100% 94% 91% 93%  ?Weight:

## 2021-07-24 NOTE — ED Notes (Signed)
Pt verbalizes understanding of discharge instructions. Opportunity for questioning and answers were provided. Pt discharged from ED to home with husband.    

## 2021-10-02 NOTE — Progress Notes (Deleted)
56 y.o. G11P1011 Married White or Caucasian Not Hispanic or Latino female here for annual exam.      Patient's last menstrual period was 06/09/2016 (exact date).          Sexually active: {yes no:314532}  The current method of family planning is {contraception:315051}.    Exercising: {yes no:314532}  {types:19826} Smoker:  {YES [id number]  Health Maintenance: Pap:  10/05/20 WNL HR HPV Neg 06/11/2016 Normal Hpv Neg    History of abnormal Pap:  no MMG:  09/29/19 Density B Bi-rads 1 neg  BMD:   never  Colonoscopy: 2015 polyps patient aware is due *** TDaP:  09/30/19  Gardasil: none    reports that she has quit smoking. She has never used smokeless tobacco. She reports that she does not drink alcohol and does not use drugs.  Past Medical History:  Diagnosis Date   Amenorrhea    Anxiety    Depression    Diabetes mellitus ([place])    Dyspareunia    Hypertension    Urinary incontinence     Past Surgical History:  Procedure Laterality Date   CARPAL TUNNEL RELEASE Right    CESAREAN SECTION     TUBAL LIGATION      Current Outpatient Medications  Medication Sig Dispense Refill   amLODipine (NORVASC) 2.5 MG tablet Take 2.5 mg by mouth daily.     aspirin 81 MG chewable tablet 1 tablet     BD PEN NEEDLE NANO U/F 32G X 4 MM MISC      CRESTOR 10 MG tablet daily.     Estradiol (VAGIFEM) 10 MCG TABS vaginal tablet Place 1 tablet (10 mcg total) vaginally 2 (two) times a week. 24 tablet 3   finasteride (PROSCAR) 5 MG tablet Take 5 mg by mouth daily.     FLUoxetine HCl 60 MG TABS 1 capsule     Insulin Glargine (BASAGLAR KWIKPEN) 100 UNIT/ML Inject into the skin daily.     metFORMIN (GLUCOPHAGE) 1000 MG tablet Take 1,000 mg by mouth daily with breakfast.     ONE TOUCH ULTRA TEST test strip      OZEMPIC, 1 MG/DOSE, 4 MG/3ML SOPN Inject 1 mg into the skin once a week.     pantoprazole (PROTONIX) 40 MG tablet Take 1 tablet by mouth every morning.     REXULTI 1 MG TABS tablet Take 1 mg by mouth at  bedtime.     telmisartan-hydrochlorothiazide (MICARDIS HCT) 80-25 MG per tablet daily.     traZODone (DESYREL) 150 MG tablet 1 tablet at bedtime.     No current facility-administered medications for this visit.    Family History  Problem Relation Age of Onset   Diabetes Mother    Hypertension Mother    Stroke Mother    Heart attack Mother    Hypertension Brother    Heart attack Brother     Review of Systems  Exam:   LMP 06/09/2016 (Exact Date)   Weight change: '@WEIGHTCHANGE'$ @ Height:      Ht Readings from Last 3 Encounters:  07/23/21 '5\' 3"'$  (1.6 m)  10/05/20 '5\' 3"'$  (1.6 m)  09/30/19 '5\' 3"'$  (1.6 m)    General appearance: alert, cooperative and appears stated age Head: Normocephalic, without obvious abnormality, atraumatic Neck: no adenopathy, supple, symmetrical, trachea midline and thyroid {CHL AMB PHY EX THYROID NORM [phone]::"normal to inspection and palpation"} Lungs: clear to auscultation bilaterally Cardiovascular: regular rate and rhythm Breasts: {Exam; breast:13139::"normal appearance, no masses or tenderness"} Abdomen: soft, non-tender; non  distended,  no masses,  no organomegaly Extremities: extremities normal, atraumatic, no cyanosis or edema Skin: Skin color, texture, turgor normal. No rashes or lesions Lymph nodes: Cervical, supraclavicular, and axillary nodes normal. No abnormal inguinal nodes palpated Neurologic: Grossly normal   Pelvic: External genitalia:  no lesions              Urethra:  normal appearing urethra with no masses, tenderness or lesions              Bartholins and Skenes: normal                 Vagina: normal appearing vagina with normal color and discharge, no lesions              Cervix: {CHL AMB PHY EX CERVIX NORM DEFAULT:[phone]::"no lesions"}               Bimanual Exam:  Uterus:  {CHL AMB PHY EX UTERUS NORM DEFAULT:[phone]::"normal size, contour, position, consistency, mobility, non-tender"}              Adnexa: {CHL  AMB PHY EX ADNEXA NO MASS DEFAULT:[phone]::"no mass, fullness, tenderness"}               Rectovaginal: Confirms               Anus:  normal sphincter tone, no lesions  *** chaperoned for the exam.  A:  Well Woman with normal exam  P:

## 2021-10-04 ENCOUNTER — Ambulatory Visit: Payer: 59

## 2021-10-05 ENCOUNTER — Ambulatory Visit
Admission: RE | Admit: 2021-10-05 | Discharge: 2021-10-05 | Disposition: A | Discharge status: Home / Self Care | Payer: 59 | Source: Ambulatory Visit | Attending: Family Medicine | Admitting: Family Medicine

## 2021-10-05 DIAGNOSIS — Z1231 Encounter for screening mammogram for malignant neoplasm of breast: Secondary | ICD-10-CM

## 2021-10-10 ENCOUNTER — Other Ambulatory Visit: Payer: Self-pay | Admitting: Obstetrics and Gynecology

## 2021-10-10 ENCOUNTER — Ambulatory Visit: Payer: 59 | Admitting: Obstetrics and Gynecology

## 2021-10-10 DIAGNOSIS — N952 Postmenopausal atrophic vaginitis: Secondary | ICD-10-CM

## 2021-10-10 NOTE — Telephone Encounter (Signed)
AEX was 10/05/20. AEX scheduled for 10/22/21  Neg Mammo 10/05/21.

## 2021-10-15 NOTE — Progress Notes (Deleted)
56 y.o. G49P1011 Married White or Caucasian Not Hispanic or Latino female here for annual exam.      Patient's last menstrual period was 06/09/2016 (exact date).          Sexually active: {yes no:314532}  The current method of family planning is tubal ligation.    Exercising: {yes no:314532}  {types:19826} Smoker:  {YES [id number]  Health Maintenance: Pap: 10/05/20 WNL HR HPV Neg,  06/11/2016 Normal Hpv Neg   04/13/13 wnl History of abnormal Pap:  no MMG:  10/09/21 density B Bi-rads 1 neg  BMD:   never  Colonoscopy: 2015 Polyps is aware she is due*** TDaP:  09/30/19  Gardasil: none    reports that she has quit smoking. She has never used smokeless tobacco. She reports that she does not drink alcohol and does not use drugs.  Past Medical History:  Diagnosis Date   Amenorrhea    Anxiety    Depression    Diabetes mellitus ([place])    Dyspareunia    Hypertension    Urinary incontinence     Past Surgical History:  Procedure Laterality Date   CARPAL TUNNEL RELEASE Right    CESAREAN SECTION     TUBAL LIGATION      Current Outpatient Medications  Medication Sig Dispense Refill   amLODipine (NORVASC) 2.5 MG tablet Take 2.5 mg by mouth daily.     aspirin 81 MG chewable tablet 1 tablet     BD PEN NEEDLE NANO U/F 32G X 4 MM MISC      CRESTOR 10 MG tablet daily.     finasteride (PROSCAR) 5 MG tablet Take 5 mg by mouth daily.     FLUoxetine HCl 60 MG TABS 1 capsule     Insulin Glargine (BASAGLAR KWIKPEN) 100 UNIT/ML Inject into the skin daily.     metFORMIN (GLUCOPHAGE) 1000 MG tablet Take 1,000 mg by mouth daily with breakfast.     ONE TOUCH ULTRA TEST test strip      OZEMPIC, 1 MG/DOSE, 4 MG/3ML SOPN Inject 1 mg into the skin once a week.     pantoprazole (PROTONIX) 40 MG tablet Take 1 tablet by mouth every morning.     REXULTI 1 MG TABS tablet Take 1 mg by mouth at bedtime.     telmisartan-hydrochlorothiazide (MICARDIS HCT) 80-25 MG per tablet daily.     traZODone (DESYREL) 150 MG  tablet 1 tablet at bedtime.     VAGIFEM 10 MCG TABS vaginal tablet PLACE 1 TABLET VAGINALLY 2 TIMES A WEEK. 24 tablet 0   No current facility-administered medications for this visit.    Family History  Problem Relation Age of Onset   Diabetes Mother    Hypertension Mother    Stroke Mother    Heart attack Mother    Hypertension Brother    Heart attack Brother     Review of Systems  Exam:   LMP 06/09/2016 (Exact Date)   Weight change: '@WEIGHTCHANGE'$ @ Height:      Ht Readings from Last 3 Encounters:  07/23/21 '5\' 3"'$  (1.6 m)  10/05/20 '5\' 3"'$  (1.6 m)  09/30/19 '5\' 3"'$  (1.6 m)    General appearance: alert, cooperative and appears stated age Head: Normocephalic, without obvious abnormality, atraumatic Neck: no adenopathy, supple, symmetrical, trachea midline and thyroid {CHL AMB PHY EX THYROID NORM DEFAULT:[phone]::"normal to inspection and palpation"} Lungs: clear to auscultation bilaterally Cardiovascular: regular rate and rhythm Breasts: {Exam; breast:13139::"normal appearance, no masses or tenderness"} Abdomen: soft, non-tender; non distended,  no  masses,  no organomegaly Extremities: extremities normal, atraumatic, no cyanosis or edema Skin: Skin color, texture, turgor normal. No rashes or lesions Lymph nodes: Cervical, supraclavicular, and axillary nodes normal. No abnormal inguinal nodes palpated Neurologic: Grossly normal   Pelvic: External genitalia:  no lesions              Urethra:  normal appearing urethra with no masses, tenderness or lesions              Bartholins and Skenes: normal                 Vagina: normal appearing vagina with normal color and discharge, no lesions              Cervix: {CHL AMB PHY EX CERVIX NORM DEFAULT:[phone]::"no lesions"}               Bimanual Exam:  Uterus:  {CHL AMB PHY EX UTERUS NORM DEFAULT:[phone]::"normal size, contour, position, consistency, mobility, non-tender"}              Adnexa: {CHL AMB PHY EX ADNEXA NO MASS  DEFAULT:[phone]::"no mass, fullness, tenderness"}               Rectovaginal: Confirms               Anus:  normal sphincter tone, no lesions  *** chaperoned for the exam.  A:  Well Woman with normal exam  P:

## 2021-10-22 ENCOUNTER — Ambulatory Visit: Payer: 59 | Admitting: Obstetrics and Gynecology

## 2022-01-01 ENCOUNTER — Other Ambulatory Visit: Payer: Self-pay | Admitting: Obstetrics and Gynecology

## 2022-01-01 DIAGNOSIS — N952 Postmenopausal atrophic vaginitis: Secondary | ICD-10-CM

## 2022-01-07 NOTE — Telephone Encounter (Signed)
LVMTCB

## 2022-01-09 NOTE — Telephone Encounter (Signed)
Mychart msg unread and no returned call.  Last AEX 09/2020--nothing scheduled, mammo UTD. Please advise.

## 2022-05-01 ENCOUNTER — Ambulatory Visit: Payer: 59 | Admitting: Physician Assistant

## 2022-11-05 ENCOUNTER — Other Ambulatory Visit: Payer: Self-pay | Admitting: Family Medicine

## 2022-11-05 DIAGNOSIS — Z1231 Encounter for screening mammogram for malignant neoplasm of breast: Secondary | ICD-10-CM

## 2022-12-05 ENCOUNTER — Ambulatory Visit
Admission: RE | Admit: 2022-12-05 | Discharge: 2022-12-05 | Disposition: A | Discharge status: Home / Self Care | Payer: 59 | Source: Ambulatory Visit | Attending: Family Medicine | Admitting: Family Medicine

## 2022-12-05 DIAGNOSIS — Z1231 Encounter for screening mammogram for malignant neoplasm of breast: Secondary | ICD-10-CM

## 2023-01-08 ENCOUNTER — Encounter: Payer: Self-pay | Admitting: Radiology

## 2023-01-08 ENCOUNTER — Ambulatory Visit (INDEPENDENT_AMBULATORY_CARE_PROVIDER_SITE_OTHER): Payer: 59 | Admitting: Radiology

## 2023-01-08 VITALS — BP 122/84 | Ht 62.5 in | Wt 186.0 lb

## 2023-01-08 DIAGNOSIS — N952 Postmenopausal atrophic vaginitis: Secondary | ICD-10-CM | POA: Diagnosis not present

## 2023-01-08 DIAGNOSIS — N958 Other specified menopausal and perimenopausal disorders: Secondary | ICD-10-CM

## 2023-01-08 DIAGNOSIS — N3946 Mixed incontinence: Secondary | ICD-10-CM

## 2023-01-08 DIAGNOSIS — F5231 Female orgasmic disorder: Secondary | ICD-10-CM

## 2023-01-08 DIAGNOSIS — Z01419 Encounter for gynecological examination (general) (routine) without abnormal findings: Secondary | ICD-10-CM

## 2023-01-08 MED ORDER — ESTRADIOL 10 MCG VA TABS: 1.0000 | ORAL_TABLET | VAGINAL | 4 refills | Status: DC

## 2023-01-08 NOTE — Progress Notes (Signed)
Gabrielle Russell 1965-11-11 [id number]   History: Postmenopausal 57 y.o. presents for annual exam. C/o decreased libido, pain with intercourse, inability to orgasm as she could previously. Mixed urinary incontinence, was seeing [facility] for PFPT however she missed an appt and was discharged from the practice, would like a referral elsewhere. Did not use vagifem as prescribed, would like to try again using it twice weekly.    Gynecologic History Postmenopausal Last Pap: 2022. Results were: normal Last mammogram: 2023. Results were: normal Last colonoscopy: 2022   Obstetric History OB History  Gravida Para Term Preterm AB Living  2 1 1   1 1   SAB IAB Ectopic Multiple Live Births    1     1    # Outcome Date GA Lbr Len/2nd Weight Sex Type Anes PTL Lv  2 IAB           1 Term     M CS-Classical   LIV    Obstetric Comments  1 biological child, 2 step children, 1 adopted child     The following portions of the patient's history were reviewed and updated as appropriate: allergies, current medications, past family history, past medical history, past social history, past surgical history, and problem list.  Review of Systems Pertinent items noted in HPI and remainder of comprehensive ROS otherwise negative.  Past medical history, past surgical history, family history and social history were all reviewed and documented in the EPIC chart.  Exam:  Vitals:   01/08/23 1318  BP: 122/84  Weight: 186 lb (84.4 kg)  Height: 5' 2.5" (1.588 m)   Body mass index is 33.48 kg/m.  General appearance:  Normal Thyroid:  Symmetrical, normal in size, without palpable masses or nodularity. Respiratory  Auscultation:  Clear without wheezing or rhonchi Cardiovascular  Auscultation:  Regular rate, without rubs, murmurs or gallops  Edema/varicosities:  Not grossly evident Abdominal  Soft,nontender, without masses, guarding or rebound.  Liver/spleen:  No organomegaly noted  Hernia:  None  appreciated  Skin  Inspection:  Grossly normal Breasts: Examined lying and sitting.   Right: Without masses, retractions, nipple discharge or axillary adenopathy.   Left: Without masses, retractions, nipple discharge or axillary adenopathy. Genitourinary   Inguinal/mons:  Normal without inguinal adenopathy  External genitalia:  Normal appearing vulva with no masses, tenderness, or lesions  BUS/Urethra/Skene's glands:  Normal  Vagina:  Normal appearing with normal color and discharge, no lesions. Atrophy: moderate   Cervix:  Normal appearing without discharge or lesions  Uterus:  Normal in size, shape and contour.  Midline and mobile, nontender  Adnexa/parametria:     Rt: Normal in size, without masses or tenderness.   Lt: Normal in size, without masses or tenderness.  Anus and perineum: Normal    Raynelle Fanning, CMA present for exam  Assessment/Plan:   1. 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 Genitourinary syndrome of menopause - Estradiol (VAGIFEM) 10 MCG TABS vaginal tablet; Place 1 tablet (10 mcg total) vaginally 2 (two) times a week.  Dispense: 24 tablet; Refill: 4 - Ambulatory referral to Physical Therapy  4. Mixed stress and urge urinary incontinence - Ambulatory referral to Physical Therapy    Discussed SBE, colonoscopy and DEXA screening as directed. Recommend of exercise weekly, including weight bearing exercise. Encouraged the use of seatbelts and sunscreen.  Return in 1 year for annual or sooner prn.  Arlie Solomons B WHNP-BC, 1:46 PM 01/08/2023

## 2023-04-08 NOTE — Therapy (Deleted)
OUTPATIENT PHYSICAL THERAPY FEMALE PELVIC EVALUATION   Patient Name: Gabrielle Russell MRN: [id number] DOB:1966/01/10, 57 y.o., female Today's Date: 04/08/2023  END OF SESSION:   Past Medical History:  Diagnosis Date   Amenorrhea    Anxiety    Depression    Diabetes mellitus (HCC)    Dyspareunia    Hypertension    Urinary incontinence    Past Surgical History:  Procedure Laterality Date   CARPAL TUNNEL RELEASE Right    CESAREAN SECTION     TUBAL LIGATION     Patient Active Problem List   Diagnosis Date Noted   Essential hypertension 10/05/2020   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   Hyperglycemia due to type 2 diabetes mellitus (HCC) 10/05/2020   Polycystic ovaries 10/05/2020   Recurrent major depression in remission (HCC) 10/05/2020   Sleep disturbance 10/05/2020   Vitamin D deficiency 10/05/2020   History of colonic polyps 02/11/2016   Type II or unspecified type diabetes mellitus without mention of complication, not stated as uncontrolled 12/15/2013    Class: History of   Accelerated hypertension 12/15/2013   Elevated cholesterol 12/15/2013    Class: History of    PCP: Selena Batten, MD  REFERRING PROVIDER: Tanda Rockers, NP   REFERRING DIAG:  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  Rationale for Evaluation and Treatment: {HABREHAB:27488}  ONSET DATE: ***  SUBJECTIVE:                                                                                                                                                                                           SUBJECTIVE STATEMENT: Stress and urge incontinence Fluid intake: {Yes/No:304960894}   PAIN:  Are you having pain? {yes/no:20286} NPRS scale: ***/10 Pain location: {pelvic pain location:27098}  Pain type: {type:313116} Pain  description: {PAIN DESCRIPTION:21022940}   Aggravating factors: *** Relieving factors: ***  PRECAUTIONS: {Therapy precautions:24002}  RED FLAGS: {PT Red Flags:29287}   WEIGHT BEARING RESTRICTIONS: {Yes ***/No:24003}  FALLS:  Has patient fallen in last 6 months? {fallsyesno:27318}  LIVING ENVIRONMENT: Lives with: {[facility] lives with:25569::"lives with their family"} Lives in: {Lives in:25570} Stairs: {opstairs:27293} Has following equipment at home: {Assistive devices:23999}  OCCUPATION: ***  PLOF: {PLOF:24004}  PATIENT GOALS: ***  PERTINENT HISTORY:  *** Sexual abuse: {Yes/No:304960894}  BOWEL MOVEMENT: Pain with bowel movement: {yes/no:20286} Type of bowel movement:{PT BM type:27100} Fully empty rectum: {Yes/No:304960894} Leakage: {Yes/No:304960894} Pads: {Yes/No:304960894} Fiber supplement: {Yes/No:304960894}  URINATION: Pain with urination: {yes/no:20286} Fully empty bladder: {Yes/No:304960894} Stream: {PT urination:27102}  Urgency: {Yes/No:304960894} Frequency: *** Leakage: {PT leakage:27103} Pads: {Yes/No:304960894}  INTERCOURSE:pain Pain with intercourse: {pain with intercourse PA:27099} Ability to have vaginal penetration:  {Yes/No:304960894} Climax: no Marinoff Scale: ***/3  PREGNANCY: Vaginal deliveries *** Tearing {Yes***/No:304960894} C-section deliveries *** Currently pregnant {Yes***/No:304960894}  PROLAPSE: {PT prolapse:27101}   OBJECTIVE:  Note: Objective measures were completed at Evaluation unless otherwise noted.  DIAGNOSTIC FINDINGS:  ***  PATIENT SURVEYS:  {rehab surveys:24030}  PFIQ-7 ***  COGNITION: Overall cognitive status: {cognition:24006}     SENSATION: Light touch: {intact/deficits:24005} Proprioception: {intact/deficits:24005}  MUSCLE LENGTH: Hamstrings: Right *** deg; Left *** deg Thomas test: Right *** deg; Left *** deg  LUMBAR SPECIAL TESTS:  {lumbar special test:25242}  FUNCTIONAL TESTS:  {Functional  tests:24029}  GAIT: Distance walked: *** Assistive device utilized: {Assistive devices:23999} Level of assistance: {Levels of assistance:24026} Comments: ***  POSTURE: {posture:25561}  PELVIC ALIGNMENT:  LUMBARAROM/PROM:  A/PROM A/PROM  eval  Flexion   Extension   Right lateral flexion   Left lateral flexion   Right rotation   Left rotation    (Blank rows = not tested)  LOWER EXTREMITY ROM:  {AROM/PROM:27142} ROM Right eval Left eval  Hip flexion    Hip extension    Hip abduction    Hip adduction    Hip internal rotation    Hip external rotation    Knee flexion    Knee extension    Ankle dorsiflexion    Ankle plantarflexion    Ankle inversion    Ankle eversion     (Blank rows = not tested)  LOWER EXTREMITY MMT:  MMT Right eval Left eval  Hip flexion    Hip extension    Hip abduction    Hip adduction    Hip internal rotation    Hip external rotation    Knee flexion    Knee extension    Ankle dorsiflexion    Ankle plantarflexion    Ankle inversion    Ankle eversion     PALPATION:   General  ***                External Perineal Exam ***                             Internal Pelvic Floor ***  Patient confirms identification and approves PT to assess internal pelvic floor and treatment {yes/no:20286}  PELVIC MMT:   MMT eval  Vaginal   Internal Anal Sphincter   External Anal Sphincter   Puborectalis   Diastasis Recti   (Blank rows = not tested)        TONE: ***  PROLAPSE: ***  TODAY'S TREATMENT:                                                                                                                              DATE: ***  EVAL ***   PATIENT EDUCATION:  Education details: *** Person educated: {  Person educated:25204} Education method: {Education Method:25205} Education comprehension: {Education Comprehension:25206}  HOME EXERCISE PROGRAM: ***  ASSESSMENT:  CLINICAL IMPRESSION: Patient is a *** y.o. *** who was seen  today for physical therapy evaluation and treatment for ***.   OBJECTIVE IMPAIRMENTS: {opptimpairments:25111}.   ACTIVITY LIMITATIONS: {activitylimitations:27494}  PARTICIPATION LIMITATIONS: {participationrestrictions:25113}  PERSONAL FACTORS: {Personal factors:25162} are also affecting patient's functional outcome.   REHAB POTENTIAL: {rehabpotential:25112}  CLINICAL DECISION MAKING: {clinical decision making:25114}  EVALUATION COMPLEXITY: {Evaluation complexity:25115}   GOALS: Goals reviewed with patient? {yes/no:20286}  SHORT TERM GOALS: Target date: ***  *** Baseline: Goal status: INITIAL  2.  *** Baseline:  Goal status: INITIAL  3.  *** Baseline:  Goal status: INITIAL  4.  *** Baseline:  Goal status: INITIAL  5.  *** Baseline:  Goal status: INITIAL  6.  *** Baseline:  Goal status: INITIAL  LONG TERM GOALS: Target date: ***  *** Baseline:  Goal status: INITIAL  2.  *** Baseline:  Goal status: INITIAL  3.  *** Baseline:  Goal status: INITIAL  4.  *** Baseline:  Goal status: INITIAL  5.  *** Baseline:  Goal status: INITIAL  6.  *** Baseline:  Goal status: INITIAL  PLAN:  PT FREQUENCY: {rehab frequency:25116}  PT DURATION: {rehab duration:25117}  PLANNED INTERVENTIONS: {rehab planned interventions:25118::"97110-Therapeutic exercises","97530- Therapeutic [id number]- Neuromuscular re-education","97535- Self HYQM","57846- Manual therapy"}  PLAN FOR NEXT SESSION: ***   Haris Baack, PT 04/08/2023, 8:47 AM

## 2023-04-09 ENCOUNTER — Ambulatory Visit: Payer: 59 | Admitting: Physical Therapy

## 2023-04-15 ENCOUNTER — Other Ambulatory Visit (HOSPITAL_BASED_OUTPATIENT_CLINIC_OR_DEPARTMENT_OTHER): Payer: Self-pay | Admitting: Family Medicine

## 2023-04-15 DIAGNOSIS — R0789 Other chest pain: Secondary | ICD-10-CM

## 2023-04-17 NOTE — Therapy (Unsigned)
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   Hyperglycemia due to type 2 diabetes mellitus (HCC) 10/05/2020   Polycystic ovaries 10/05/2020   Recurrent major depression in remission (HCC) 10/05/2020   Sleep disturbance 10/05/2020   Vitamin D deficiency 10/05/2020   History of colonic polyps 02/11/2016   Type II or unspecified type diabetes mellitus without mention of complication, not stated as uncontrolled 12/15/2013    Class: History of   Accelerated hypertension 12/15/2013   Elevated cholesterol 12/15/2013    Class: History of    PCP: Gweneth Dimitri, MD  REFERRING PROVIDER: Tanda Rockers, NP   REFERRING DIAG:  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  Rationale for Evaluation and Treatment: Rehabilitation  ONSET DATE: ***  SUBJECTIVE:                                                                                                                                                                                           SUBJECTIVE STATEMENT: *** Fluid intake: {Yes/No:304960894}   PAIN:  Are you having pain? {yes/no:20286} NPRS scale: ***/10 Pain location: {pelvic pain location:27098}  Pain type: {type:313116} Pain description: {PAIN  DESCRIPTION:21022940}   Aggravating factors: *** Relieving factors: ***  PRECAUTIONS: {Therapy precautions:24002}  RED FLAGS: {PT Red Flags:29287}   WEIGHT BEARING RESTRICTIONS: {Yes ***/No:24003}  FALLS:  Has patient fallen in last 6 months? {fallsyesno:27318}  LIVING ENVIRONMENT: Lives with: {[facility] lives with:25569::"lives with their family"} Lives in: {Lives in:[address]70} Stairs: {opstairs:27293} Has following equipment at home: {Assistive devices:23999}  OCCUPATION: ***  PLOF: {PLOF:24004}  PATIENT GOALS: ***  PERTINENT HISTORY:  Diabetes; Cesarean section Sexual abuse: {Yes/No:304960894}  BOWEL MOVEMENT: Pain with bowel movement: {yes/no:20286} Type of bowel movement:{PT BM type:27100} Fully empty rectum: {Yes/No:304960894} Leakage: {Yes/No:304960894} Pads: {Yes/No:304960894} Fiber supplement: {Yes/No:304960894}  URINATION: Pain with urination: {yes/no:20286} Fully empty bladder: {Yes/No:304960894} Stream: {PT urination:27102} Urgency: {  GNF/AO:130865784} Frequency: *** Leakage: {PT leakage:27103} Pads: {Yes/No:304960894}  INTERCOURSE: Pain with intercourse: {pain with intercourse PA:27099} Ability to have vaginal penetration:  {Yes/No:304960894} Climax: inablity to orgasm like she used to [employer] Scale: ***/3  PREGNANCY: Vaginal deliveries *** Tearing {Yes***/No:304960894} C-section deliveries *** Currently pregnant {Yes***/No:304960894}  PROLAPSE: {PT prolapse:27101}   OBJECTIVE:  Note: Objective measures were completed at Evaluation unless otherwise noted.  DIAGNOSTIC FINDINGS:  ***  PATIENT SURVEYS:  {rehab surveys:24030}  PFIQ-7 ***  COGNITION: Overall cognitive status: {cognition:24006}     SENSATION: Light touch: {intact/deficits:24005} Proprioception: {intact/deficits:24005}  MUSCLE LENGTH: Hamstrings: Right *** deg; Left *** deg Thomas test: Right *** deg; Left *** deg  LUMBAR SPECIAL TESTS:  {lumbar special  test:25242}  FUNCTIONAL TESTS:  {Functional tests:24029}  GAIT: Distance walked: *** Assistive device utilized: {Assistive devices:23999} Level of assistance: {Levels of assistance:24026} Comments: ***  POSTURE: {posture:25561}  PELVIC ALIGNMENT:  LUMBARAROM/PROM:  A/PROM A/PROM  eval  Flexion   Extension   Right lateral flexion   Left lateral flexion   Right rotation   Left rotation    (Blank rows = not tested)  LOWER EXTREMITY ROM:  {AROM/PROM:27142} ROM Right eval Left eval  Hip flexion    Hip extension    Hip abduction    Hip adduction    Hip internal rotation    Hip external rotation    Knee flexion    Knee extension    Ankle dorsiflexion    Ankle plantarflexion    Ankle inversion    Ankle eversion     (Blank rows = not tested)  LOWER EXTREMITY MMT:  MMT Right eval Left eval  Hip flexion    Hip extension    Hip abduction    Hip adduction    Hip internal rotation    Hip external rotation    Knee flexion    Knee extension    Ankle dorsiflexion    Ankle plantarflexion    Ankle inversion    Ankle eversion     PALPATION:   General  ***                External Perineal Exam ***                             Internal Pelvic Floor ***  Patient confirms identification and approves PT to assess internal pelvic floor and treatment {yes/no:20286}  PELVIC MMT:   MMT eval  Vaginal   Internal Anal Sphincter   External Anal Sphincter   Puborectalis   Diastasis Recti   (Blank rows = not tested)        TONE: ***  PROLAPSE: ***  TODAY'S TREATMENT:                                                                                                                              DATE: ***  EVAL ***   PATIENT EDUCATION:  Education details: *** Person educated: {Person educated:25204} Education method: {Education Method:25205} Education comprehension: {Education Comprehension:25206}  HOME EXERCISE PROGRAM: ***  ASSESSMENT:  CLINICAL  IMPRESSION: Patient is a *** y.o. *** who was seen today for physical therapy evaluation and treatment for ***.   OBJECTIVE IMPAIRMENTS: {opptimpairments:25111}.   ACTIVITY LIMITATIONS: {activitylimitations:27494}  PARTICIPATION LIMITATIONS: {participationrestrictions:25113}  PERSONAL FACTORS: {Personal factors:25162} are also affecting patient's functional outcome.   REHAB POTENTIAL: {rehabpotential:25112}  CLINICAL DECISION MAKING: {clinical decision making:25114}  EVALUATION COMPLEXITY: {Evaluation complexity:25115}   GOALS: Goals reviewed with patient? {yes/no:20286}  SHORT TERM GOALS: Target date: ***  *** Baseline: Goal status: INITIAL  2.  *** Baseline:  Goal status: INITIAL  3.  *** Baseline:  Goal status: INITIAL  4.  *** Baseline:  Goal status: INITIAL  5.  *** Baseline:  Goal status: INITIAL  6.  *** Baseline:  Goal status: INITIAL  LONG TERM GOALS: Target date: ***  *** Baseline:  Goal status: INITIAL  2.  *** Baseline:  Goal status: INITIAL  3.  *** Baseline:  Goal status: INITIAL  4.  *** Baseline:  Goal status: INITIAL  5.  *** Baseline:  Goal status: INITIAL  6.  *** Baseline:  Goal status: INITIAL  PLAN:  PT FREQUENCY: {rehab frequency:25116}  PT DURATION: {rehab duration:25117}  PLANNED INTERVENTIONS: {rehab planned interventions:25118::"97110-Therapeutic exercises","97530- Therapeutic [phone]- Neuromuscular re-education","97535- Self QMVH","84696- Manual therapy"}  PLAN FOR NEXT SESSION: ***   Abou Sterkel, PT 04/17/2023, 8:29 AM

## 2023-04-18 ENCOUNTER — Ambulatory Visit: Payer: 59 | Admitting: Physical Therapy

## 2023-04-23 ENCOUNTER — Other Ambulatory Visit: Payer: Self-pay

## 2023-04-23 ENCOUNTER — Encounter: Payer: Self-pay | Admitting: Physical Therapy

## 2023-04-23 ENCOUNTER — Ambulatory Visit: Payer: 59 | Attending: Radiology | Admitting: Physical Therapy

## 2023-04-23 DIAGNOSIS — R279 Unspecified lack of coordination: Secondary | ICD-10-CM | POA: Diagnosis present

## 2023-04-23 DIAGNOSIS — N958 Other specified menopausal and perimenopausal disorders: Secondary | ICD-10-CM | POA: Insufficient documentation

## 2023-04-23 DIAGNOSIS — F5231 Female orgasmic disorder: Secondary | ICD-10-CM | POA: Insufficient documentation

## 2023-04-23 DIAGNOSIS — L905 Scar conditions and fibrosis of skin: Secondary | ICD-10-CM

## 2023-04-23 DIAGNOSIS — N3946 Mixed incontinence: Secondary | ICD-10-CM | POA: Diagnosis not present

## 2023-04-23 DIAGNOSIS — M6281 Muscle weakness (generalized): Secondary | ICD-10-CM | POA: Diagnosis present

## 2023-04-23 NOTE — Therapy (Signed)
OUTPATIENT PHYSICAL THERAPY FEMALE PELVIC EVALUATION   Patient Name: Gabrielle Russell MRN: [id number] DOB:03-16-1966, 57 y.o., female Today's Date: 04/23/2023  END OF SESSION:  PT End of Session - 04/23/23 1236     Visit Number 1    Date for PT Re-Evaluation 10/22/23    Authorization Type [employer]    PT Start Time 1230    PT Stop Time 1310    PT Time Calculation (min) 40 min    Activity Tolerance Patient tolerated treatment well    Behavior During Therapy WFL for tasks assessed/performed             Past Medical History:  Diagnosis Date   Amenorrhea    Anxiety    Depression    Diabetes mellitus (HCC)    Dyspareunia    Hypertension    Urinary incontinence    Past Surgical History:  Procedure Laterality Date   CARPAL TUNNEL RELEASE Right    CESAREAN SECTION     TUBAL LIGATION     Patient Active Problem List   Diagnosis Date Noted   Essential hypertension 10/05/2020   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   Hyperglycemia due to type 2 diabetes mellitus (HCC) 10/05/2020   Polycystic ovaries 10/05/2020   Recurrent major depression in remission (HCC) 10/05/2020   Sleep disturbance 10/05/2020   Vitamin D deficiency 10/05/2020   History of colonic polyps 02/11/2016   Type II or unspecified type diabetes mellitus without mention of complication, not stated as uncontrolled 12/15/2013    Class: History of   Accelerated hypertension 12/15/2013   Elevated cholesterol 12/15/2013    Class: History of    PCP: Gweneth Dimitri, MD  REFERRING PROVIDER: Tanda Rockers, NP   REFERRING DIAG:  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.46 (ICD-10-CM) - Mixed stress and urge urinary incontinence    THERAPY DIAG:  Muscle weakness (generalized) - Plan: PT plan of care cert/re-cert  Unspecified lack of coordination - Plan: PT plan of care cert/re-cert  Scar - Plan: PT plan of care cert/re-cert  Rationale  for Evaluation and Treatment: Rehabilitation  ONSET DATE: 2016  SUBJECTIVE:                                                                                                                                                                                           SUBJECTIVE STATEMENT:  Mixed urinary incontinence has been going on for years. Vaginal atrophy has been going on since menopause and painful sex. Vaginal exam needs a very small speculum. Had a pelvic ultrasound with pain.  Fluid intake: Yes: water  and diet sundrop     PAIN:  Are you having pain? Yes NPRS scale: 10/10 Pain location: Vaginal  Pain type: stabbing Pain description: intermittent   Aggravating factors: with penile penetration  Relieving factors: no penile penetration  PRECAUTIONS: None  RED FLAGS: None   WEIGHT BEARING RESTRICTIONS: No  FALLS:  Has patient fallen in last 6 months? 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  PLOF: Independent  PATIENT GOALS: increased control of urinary leakage, reduced pain with intercourse, ways to manage symptoms of menopause  PERTINENT HISTORY:  Diabetes, Cesarean section  BOWEL MOVEMENT: Pain with bowel movement: Yes, rectal pain 7/10 Type of bowel movement:Type (Bristol Stool Scale) type 1 , type 4, Frequency 3 times per week, Strain Yes, and Splinting yes Fully empty rectum: Yes:   Leakage: No Fiber supplement: Yes: metamucil  URINATION: Pain with urination: No Fully empty bladder: Yes:   Stream: Strong Urgency: Yes: has had urgency when she was a kid Frequency: every 2-3 hours during the day, night 2-3 times  Leakage: Urge to void, Walking to the bathroom, Coughing, and Laughing Pads: Yes: 1-2 pad  INTERCOURSE: Pain with intercourse: Initial Penetration, During Penetration, and Pain Interrupts Intercourse Ability to have vaginal penetration:  No Marinoff Scale:  0/3  PREGNANCY: C-section deliveries 1  PROLAPSE: None   OBJECTIVE:  Note: Objective measures were completed at Evaluation unless otherwise noted.  DIAGNOSTIC FINDINGS:  none   COGNITION: Overall cognitive status: Within functional limits for tasks assessed     SENSATION: Light touch: Appears intact Proprioception: Appears intact  LUMBAR SPECIAL TESTS:  Thomas test: positive bilaterally   POSTURE: decreased lumbar lordosis  PELVIC ALIGNMENT:  LUMBARAROM/PROM:lumbar ROM is limited by 25%   LOWER EXTREMITY ROM:  Active ROM Right eval Left eval  Hip flexion 4/5 4/5  Hip extension 4/5 4/5   (Blank rows = not tested)  LOWER EXTREMITY MMT:  MMT Right eval Left eval  Hip extension -5 -5   PALPATION:   General  rib cage greater than 90 degrees; tightness and skin overlaps the c-section scar                External Perineal Exam clitoral hood starting to attach to the clitorus; labia minora fused to labia majora; redness along the posterior vaginal canal                             Internal Pelvic Floor tenderness located along the levator ani and obturator internist  Patient confirms identification and approves PT to assess internal pelvic floor and treatment Yes  PELVIC MMT:   MMT eval  Vaginal 1/5  (Blank rows = not tested)        TONE: increased  PROLAPSE: none  TODAY'S TREATMENT:  DATE: 04/23/23  EVAL See below   PATIENT EDUCATION:  Education details: education on vaginal moisturizers, ways ot massage the vaginal introitus and perineal body, educated on moving the clitoral hood off the clitoris, education on vaginal moisturizers Person educated: Patient Education method: Explanation, Demonstration, Tactile cues, Verbal cues, and Handouts Education comprehension: verbalized understanding, returned demonstration, verbal  cues required, tactile cues required, and needs further education  HOME EXERCISE PROGRAM: See above  ASSESSMENT:  CLINICAL IMPRESSION: Patient is a 57 y.o. female who was seen today for physical therapy evaluation and treatment for mixed incontinence. Patient has vaginal pain at level 10/10 with vaginal penetration. She has to use the smallest speculum to be examined. She has stopped having penile penetration vaginally due to the pain. She has trouble with an internal ultrasound due to the pain. Patient has vaginal dryness. The labia minora is fused to the labia majora. The clitoral hood is starting to attach to the clitoris. She has decreased mobility of the perineal body. Tenderness located in the levator ani and obturator internist. Pelvic floor strength is 1/5. Patient will leak urine with urge to void, walking to the bathroom, coughing, and laughing. She wears 1-2 pads per day. Patient will benefit from skilled therapy to reduce her urinary leakage, educated on menopausal changes that happen to the pelvic floor and lengthen tissue.   OBJECTIVE IMPAIRMENTS: decreased activity tolerance, decreased coordination, decreased strength, increased fascial restrictions, increased muscle spasms, and pain.   ACTIVITY LIMITATIONS: continence, toileting, and locomotion level  PARTICIPATION LIMITATIONS: interpersonal relationship, community activity, and occupation  PERSONAL FACTORS: Time since onset of injury/illness/exacerbation are also affecting patient's functional outcome.   REHAB POTENTIAL: Excellent  CLINICAL DECISION MAKING: Stable/uncomplicated  EVALUATION COMPLEXITY: Low   GOALS: Goals reviewed with patient? Yes  SHORT TERM GOALS: Target date: 05/21/23  Patient educated on vaginal moisturizers to improve health of the tissue.  Baseline: Goal status: INITIAL  2.  Patient educated on scar massage to improve mobility of tissue.  Baseline:  Goal status: INITIAL  3.  Patient is able  to contract her upper abdominals to bring her ribs down and reduce the rib angle.  Baseline:  Goal status: INITIAL  4.  Patient educated on vaginal dilators and how to use to expand the vaginal canal.  Baseline:  Goal status: INITIAL   LONG TERM GOALS: Target date: 10/22/23  Patient independent with advanced HEP for core and pelvic floor to reduce leakage.  Baseline:  Goal status: INITIAL  2.  Patient is on the largest dilator to expand the vaginal canal for penetration.  Baseline:  Goal status: INITIAL  3.  Patient reports her urinary leakage decreased >/= 75% due to increased pelvic floor strength >/= 3/5.  Baseline:  Goal status: INITIAL  4.  Patient is able to have penile penetration vaginally due to the relaxation of the introitus and lengthening of the tissue from the dilator.  Baseline:  Goal status: INITIAL  5.  Patient educated on changes to the pelvic floor and tissue with menopause.  Baseline:  Goal status: INITIAL  PLAN:  PT FREQUENCY: 1x/week  PT DURATION: 6 months  PLANNED INTERVENTIONS: 97110-Therapeutic exercises, 97530- Therapeutic activity, 97112- Neuromuscular re-education, 97140- Manual therapy, 97035- Ultrasound, Patient/Family education, Dry Needling, Joint mobilization, Spinal mobilization, Scar mobilization, Cryotherapy, Moist heat, and Biofeedback  PLAN FOR NEXT SESSION: scar work, abdominal work, diaphragmatic breathing, hip flexor stretch and back stretches, hip IR stretch   Eulis Foster, PT 04/23/23 4:04 PM

## 2023-04-23 NOTE — Patient Instructions (Signed)
Moisturizers They are used in the vagina to hydrate the mucous membrane that make up the vaginal canal. Designed to keep a more normal acid balance (ph) Once placed in the vagina, it will last between two to three days.  Use 2-3 times per week at bedtime  Ingredients to avoid is glycerin and fragrance, can increase chance of infection Should not be used just before sex due to causing irritation Most are gels administered either in a tampon-shaped applicator or as a vaginal suppository. They are non-hormonal.   Types of Moisturizers(internal use)  Vitamin E vaginal suppositories- Whole foods, Amazon Moist Again Coconut oil- can break down condoms, any grocery store (prefer organic) Julva- (Do no use if taking  Tamoxifen) amazon Yes moisturizer- amazon NeuEve Silk , NeuEve Silver for menopausal or over 65 (if have severe vaginal atrophy or cancer treatments use NeuEve Silk for  1 month than move to [employer])- [employer], [place].com Olive and Bee intimate cream- [email or web address].com.au Mae vaginal moisturizer- Amazon Aloe Good Clean Love Hyaluronic acid Hyalofemme Reveree hyaluronic acid inserts   Creams to use externally on the Vulva area Marathon Oil (good for for cancer patients that had radiation to the area)- [place] or [employer].[email or web address]/ Vulva Balm/ V-magic cream by medicine mama- amazon Julva-amazon Vital "V Wild Yam salve ( help moisturize and help with thinning vulvar area, does have Beeswax MoodMaid Botanical Pro-Meno Wild Yam Cream- Amazon Desert Harvest Gele Cleo by Zane Herald labial moisturizer (Amazon),  Coconut or olive oil aloe Good Clean Love Enchanted Rose by intimate rose  Things to avoid in the vaginal area Do not use things to irritate the vulvar area No lotions just specialized creams for the vulva area- Neogyn, V-magic,  No soaps; can use Aveeno or Calendula cleanser, unscented Dove if needed. Must be gentle No deodorants No douches Good to  sleep without underwear to let the vaginal area to air out No scrubbing: spread the lips to let warm water rinse over labias and pat dry   [facility] [address], [address] [place], [place] [address] Phone # [phone] Fax [phone]

## 2023-04-30 ENCOUNTER — Encounter: Payer: Self-pay | Admitting: Physical Therapy

## 2023-04-30 ENCOUNTER — Ambulatory Visit: Payer: 59 | Admitting: Physical Therapy

## 2023-04-30 DIAGNOSIS — L905 Scar conditions and fibrosis of skin: Secondary | ICD-10-CM

## 2023-04-30 DIAGNOSIS — M6281 Muscle weakness (generalized): Secondary | ICD-10-CM

## 2023-04-30 DIAGNOSIS — R279 Unspecified lack of coordination: Secondary | ICD-10-CM

## 2023-04-30 NOTE — Therapy (Signed)
OUTPATIENT PHYSICAL THERAPY FEMALE PELVIC TREATMENT   Patient Name: Gabrielle Russell MRN: [id number] DOB:April 18, 1966, 57 y.o., female Today's Date: 04/30/2023  END OF SESSION:  PT End of Session - 04/30/23 1103     Visit Number 2    Date for PT Re-Evaluation 10/22/23    Authorization Type UHC    PT Start Time 1100    PT Stop Time 1140    PT Time Calculation (min) 40 min    Activity Tolerance Patient tolerated treatment well    Behavior During Therapy WFL for tasks assessed/performed             Past Medical History:  Diagnosis Date   Amenorrhea    Anxiety    Depression    Diabetes mellitus (HCC)    Dyspareunia    Hypertension    Urinary incontinence    Past Surgical History:  Procedure Laterality Date   CARPAL TUNNEL RELEASE Right    CESAREAN SECTION     TUBAL LIGATION     Patient Active Problem List   Diagnosis Date Noted   Essential hypertension 10/05/2020   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   Hyperglycemia due to type 2 diabetes mellitus (HCC) 10/05/2020   Polycystic ovaries 10/05/2020   Recurrent major depression in remission (HCC) 10/05/2020   Sleep disturbance 10/05/2020   Vitamin D deficiency 10/05/2020   History of colonic polyps 02/11/2016   Type II or unspecified type diabetes mellitus without mention of complication, not stated as uncontrolled 12/15/2013    Class: History of   Accelerated hypertension 12/15/2013   Elevated cholesterol 12/15/2013    Class: History of    PCP: Gweneth Dimitri, MD  REFERRING PROVIDER: Tanda Rockers, NP   REFERRING DIAG:  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  Fluid intake: Yes: water and diet sundrop     PAIN:  Are you having pain? Yes NPRS scale: 10/10 Pain location: Vaginal  Pain type: stabbing Pain description: intermittent   Aggravating factors: with penile penetration  Relieving factors: no penile penetration  PRECAUTIONS: None  RED FLAGS: None  WEIGHT BEARING RESTRICTIONS: No  FALLS:  Has patient fallen in last 6 months? 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  PLOF: Independent  PATIENT GOALS: increased control of urinary leakage, reduced pain with intercourse, ways to manage symptoms of menopause  PERTINENT HISTORY:  Diabetes, Cesarean section  BOWEL MOVEMENT: Pain with bowel movement: Yes, rectal pain 7/10 Type of bowel movement:Type (Bristol Stool Scale) type 1 , type 4, Frequency 3 times per week, Strain Yes, and Splinting yes Fully empty rectum: Yes:   Leakage: No Fiber supplement: Yes: metamucil  URINATION: Pain with urination: No Fully empty bladder: Yes:   Stream: Strong Urgency: Yes: has had urgency when she was a kid Frequency: every 2-3 hours during the day, night 2-3 times  Leakage: Urge to void, Walking to the bathroom, Coughing, and Laughing Pads: Yes: 1-2 pad  INTERCOURSE: Pain with intercourse: Initial Penetration, During Penetration, and Pain Interrupts Intercourse Ability to have vaginal penetration:  No Marinoff Scale: 0/3  PREGNANCY: C-section deliveries 1  PROLAPSE: None   OBJECTIVE:  Note: Objective measures were completed at Evaluation unless otherwise noted.  DIAGNOSTIC FINDINGS:  none   COGNITION: Overall cognitive status: Within functional limits for tasks  assessed     SENSATION: Light touch: Appears intact Proprioception: Appears intact  LUMBAR SPECIAL TESTS:  Thomas test: positive bilaterally   POSTURE: decreased lumbar lordosis  PELVIC ALIGNMENT:  LUMBARAROM/PROM:lumbar ROM is limited by 25%   LOWER EXTREMITY ROM:  Active ROM Right eval Left eval  Hip flexion 4/5 4/5  Hip extension 4/5 4/5   (Blank rows = not tested)  LOWER EXTREMITY MMT:  MMT Right eval Left eval  Hip extension -5 -5   PALPATION:   General  rib cage greater than 90 degrees; tightness and skin overlaps the c-section scar                External Perineal Exam clitoral hood starting to attach to the clitorus; labia minora fused to labia majora; redness along the posterior vaginal canal                             Internal Pelvic Floor tenderness located along the levator ani and obturator internist  Patient confirms identification and approves PT to assess internal pelvic floor and treatment Yes  PELVIC MMT:   MMT eval  Vaginal 1/5  (Blank rows = not tested)        TONE: increased  PROLAPSE: none  TODAY'S TREATMENT:   04/30/23 Manual: Scar tissue mobilization: Scar work to the c-section scar going through the restrictions to improve mobility Myofascial release: Fascial release around the mons pubis to reduce the swelling Fascial release to the round ligament Exercises: Stretches/mobility: Hip flexor stretch leaning on wall holding for 30 sec bil.  Butterfly stretch holding 1 min.  Trunk rotation pulling knee over the trunk holding 30 sec bil.  Prone press up 5 times Marjo Bicker pose with diaphragmatic breathing with emphasis on expanding the posterior rib cage Therapeutic activities: Functional strengthening activities: Educated patient on how to use the vaginal dilator, how to progress herself, how to insert the dilator and move around like a clock then use leg movement. Gave patient handout.   PATIENT  EDUCATION: 04/30/23 Education details: Access Code: [email or web address], scar massage, how to use dilators Person educated: Patient Education method: Explanation, Demonstration, Tactile cues, Verbal cues, and Handouts  Education comprehension: verbalized understanding, returned demonstration, verbal cues required, tactile cues required, and needs further education    HOME EXERCISE PROGRAM: 04/30/23 Access Code: [email or web address] URL: [email or web address] Date: 04/30/2023 Prepared by: Eulis Foster  Exercises - Gastroc Stretch on Wall  - 1 x daily - 7 x weekly - 1 sets - 2 reps - 30 sec hold - Supine Butterfly Groin Stretch  - 1 x daily - 7 x weekly - 1 sets - 1 reps - 1 min hold - Supine Piriformis Stretch with Leg Straight  - 1 x daily - 7 x weekly - 1 sets - 1 reps - 30 sec hold - Prone Press Up  - 1 x daily - 7 x weekly - 1 sets - 5 reps - Diaphragmatic Breathing in Child's Pose with Pelvic Floor Relaxation  - 1 x daily - 7 x weekly - 1 sets - 1 reps - 30 sec hold See above  ASSESSMENT:  CLINICAL IMPRESSION: Patient is a 57 y.o. female who was seen today for physical therapy treatment for mixed incontinence. She understands how to use the vaginal dilators. She had improved mobility of the c-section scar. She was able to stand more upright after the manual work. Patient was able to feel the pelvic floor relax with diaphragmatic breathing in child's pose. Patient will benefit from skilled therapy to reduce her urinary leakage, educated on menopausal changes that happen to the pelvic floor and lengthen tissue.   OBJECTIVE IMPAIRMENTS: decreased activity tolerance, decreased coordination, decreased strength, increased fascial restrictions, increased muscle spasms, and pain.   ACTIVITY LIMITATIONS: continence, toileting, and locomotion level  PARTICIPATION LIMITATIONS: interpersonal relationship, community activity, and occupation  PERSONAL FACTORS: Time since onset of  injury/illness/exacerbation are also affecting patient's functional outcome.   REHAB POTENTIAL: Excellent  CLINICAL DECISION MAKING: Stable/uncomplicated  EVALUATION COMPLEXITY: Low   GOALS: Goals reviewed with patient? Yes  SHORT TERM GOALS: Target date: 05/21/23  Patient educated on vaginal moisturizers to improve health of the tissue.  Baseline: Goal status: Met 04/30/23  2.  Patient educated on scar massage to improve mobility of tissue.  Baseline:  Goal status: met 04/30/23  3.  Patient is able to contract her upper abdominals to bring her ribs down and reduce the rib angle.  Baseline:  Goal status: INITIAL  4.  Patient educated on vaginal dilators and how to use to expand the vaginal canal.  Baseline:  Goal status: INITIAL   LONG TERM GOALS: Target date: 10/22/23  Patient independent with advanced HEP for core and pelvic floor to reduce leakage.  Baseline:  Goal status: INITIAL  2.  Patient is on the largest dilator to expand the vaginal canal for penetration.  Baseline:  Goal status: INITIAL  3.  Patient reports her urinary leakage decreased >/= 75% due to increased pelvic floor strength >/= 3/5.  Baseline:  Goal status: INITIAL  4.  Patient is able to have penile penetration vaginally due to the relaxation of the introitus and lengthening of the tissue from the dilator.  Baseline:  Goal status: INITIAL  5.  Patient educated on changes to the pelvic floor and tissue with menopause.  Baseline:  Goal status: INITIAL  PLAN:  PT FREQUENCY: 1x/week  PT DURATION: 6 months  PLANNED INTERVENTIONS: 97110-Therapeutic exercises, 97530- Therapeutic activity, 97112- Neuromuscular re-education, 97140- Manual therapy, 97035- Ultrasound, Patient/Family education, Dry Needling, Joint mobilization, Spinal mobilization, Scar mobilization, Cryotherapy, Moist heat, and Biofeedback  PLAN FOR NEXT SESSION: scar work, abdominal work, diaphragmatic breathing,  back  stretches,  hip IR stretch, hip mobiization   Eulis Foster, PT 04/30/23 1:20 PM

## 2023-05-15 ENCOUNTER — Other Ambulatory Visit (HOSPITAL_COMMUNITY): Payer: 59

## 2023-05-28 ENCOUNTER — Ambulatory Visit (HOSPITAL_COMMUNITY)
Admission: RE | Admit: 2023-05-28 | Discharge: 2023-05-28 | Disposition: A | Discharge status: Home / Self Care | Payer: Self-pay | Source: Ambulatory Visit | Attending: Family Medicine | Admitting: Family Medicine

## 2023-05-28 DIAGNOSIS — R0789 Other chest pain: Secondary | ICD-10-CM | POA: Insufficient documentation

## 2023-07-09 ENCOUNTER — Encounter: Payer: 59 | Admitting: Physical Therapy

## 2023-07-16 ENCOUNTER — Encounter: Payer: 59 | Admitting: Physical Therapy

## 2023-07-23 ENCOUNTER — Encounter: Payer: 59 | Admitting: Physical Therapy

## 2023-08-06 ENCOUNTER — Encounter: Payer: 59 | Admitting: Physical Therapy

## 2023-08-13 ENCOUNTER — Ambulatory Visit: Payer: 59 | Attending: Radiology | Admitting: Physical Therapy

## 2023-08-13 ENCOUNTER — Encounter: Payer: Self-pay | Admitting: Physical Therapy

## 2023-08-13 DIAGNOSIS — R279 Unspecified lack of coordination: Secondary | ICD-10-CM | POA: Insufficient documentation

## 2023-08-13 DIAGNOSIS — L905 Scar conditions and fibrosis of skin: Secondary | ICD-10-CM | POA: Diagnosis present

## 2023-08-13 DIAGNOSIS — M6281 Muscle weakness (generalized): Secondary | ICD-10-CM | POA: Insufficient documentation

## 2023-08-13 NOTE — Therapy (Signed)
 OUTPATIENT PHYSICAL THERAPY FEMALE PELVIC TREATMENT   Patient Name: Gabrielle Russell MRN: [id number] DOB:1965/08/17, 58 y.o., female Today's Date: 08/13/2023  END OF SESSION:  PT End of Session - 08/13/23 1234     Visit Number 3    Date for PT Re-Evaluation 10/22/23    Authorization Type UHC    PT Start Time 1230    PT Stop Time 1310    PT Time Calculation (min) 40 min    Activity Tolerance Patient tolerated treatment well    Behavior During Therapy WFL for tasks assessed/performed             Past Medical History:  Diagnosis Date   Amenorrhea    Anxiety    Depression    Diabetes mellitus (HCC)    Dyspareunia    Hypertension    Urinary incontinence    Past Surgical History:  Procedure Laterality Date   CARPAL TUNNEL RELEASE Right    CESAREAN SECTION     TUBAL LIGATION     Patient Active Problem List   Diagnosis Date Noted   Essential hypertension 10/05/2020   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   Hyperglycemia due to type 2 diabetes mellitus (HCC) 10/05/2020   Polycystic ovaries 10/05/2020   Recurrent major depression in remission (HCC) 10/05/2020   Sleep disturbance 10/05/2020   Vitamin D deficiency 10/05/2020   History of colonic polyps 02/11/2016   Type II or unspecified type diabetes mellitus without mention of complication, not stated as uncontrolled 12/15/2013    Class: History of   Accelerated hypertension 12/15/2013   Elevated cholesterol 12/15/2013    Class: History of    PCP: Gweneth Dimitri, MD  REFERRING PROVIDER: Tanda Rockers, NP   REFERRING DIAG:  F12.31 (ICD-10-CM) - 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    Fluid intake: Yes: water and diet sundrop     PAIN:  Are you having pain? Yes NPRS scale: 10/10 Pain location: Vaginal  Pain type: stabbing Pain description: intermittent   Aggravating factors: with penile penetration  Relieving factors: no penile penetration  PRECAUTIONS: None  RED FLAGS: None   WEIGHT BEARING RESTRICTIONS: No  FALLS:  Has patient fallen in last 6 months? 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  PLOF: Independent  PATIENT GOALS: increased control of urinary leakage, reduced pain with intercourse, ways to manage symptoms of menopause  PERTINENT HISTORY:  Diabetes, Cesarean section  BOWEL MOVEMENT: Pain with bowel movement: Yes, rectal pain 7/10 Type of bowel movement:Type (Bristol Stool Scale) type 1 , type 4, Frequency 3 times per week, Strain Yes, and Splinting yes Fully empty rectum: Yes:   Leakage: No Fiber supplement: Yes: metamucil  URINATION: Pain with urination: No Fully empty bladder: Yes:   Stream: Strong Urgency: Yes: has had urgency when she was a kid Frequency: every 2-3 hours during the day, night 2-3 times  Leakage: Urge to void, Walking to the bathroom, Coughing, and Laughing Pads: Yes: 1-2 pad  INTERCOURSE: Pain with intercourse: Initial Penetration, During Penetration, and Pain Interrupts Intercourse Ability to have vaginal penetration:  No Marinoff Scale: 0/3  PREGNANCY: C-section deliveries 1  PROLAPSE: None   OBJECTIVE:  Note: Objective measures were completed at Evaluation unless otherwise noted.  DIAGNOSTIC FINDINGS:  none   COGNITION: Overall cognitive status: Within functional limits for tasks  assessed     SENSATION: Light touch: Appears intact Proprioception: Appears intact  LUMBAR SPECIAL TESTS:  Thomas test: positive bilaterally   POSTURE: decreased lumbar lordosis  PELVIC ALIGNMENT:  LUMBARAROM/PROM:lumbar ROM is limited by 25%   LOWER EXTREMITY ROM:  Active ROM Right eval Left eval  Hip flexion 4/5 4/5  Hip extension 4/5 4/5   (Blank rows = not tested)  LOWER EXTREMITY MMT:  MMT Right eval Left eval  Hip extension -5 -5   PALPATION:   General  rib cage greater than 90 degrees; tightness and skin overlaps the c-section scar                External Perineal Exam clitoral hood starting to attach to the clitorus; labia minora fused to labia majora; redness along the posterior vaginal canal                             Internal Pelvic Floor tenderness located along the levator ani and obturator internist  Patient confirms identification and approves PT to assess internal pelvic floor and treatment Yes  PELVIC MMT:   MMT eval  Vaginal 1/5  (Blank rows = not tested)        TONE: increased  PROLAPSE: none  TODAY'S TREATMENT:   08/13/23 Manual: Scar tissue mobilization: Lifting up of the lower abdominal scar to improve mobility Neuromuscular re-education: Bridge to work the gluteals and increase lumbar pelvic motion Core facilitation: Open book stretch with oblique contraction bringing hands together  Quadruped lift leg with core engaged Form correction: Lay on foam roll to stretch the lateral rib cage to elongate for diaphragmatic breathing Pelvic floor contraction training: Hip flexion isometric to engage abdomen with pelvic floor Quadruped rock with hips internally rotated to stretch the pelvic floor and relax Supine hip flexor stretch to elongate the anterior trunk Self-care: Reviewed with patient on using the dilators. She is to be using them 4-5 times per week. Using lubricant on the dilators. Not to have 3 out 10 pain when using them.  Educated patient to use for 10 minutes. Educated patient on moving her hips around with the  dilator and move in and out. Educated patient on how to progress to the larger size dilator.     04/30/23 Manual: Scar tissue mobilization: Scar work to the c-section scar going through the restrictions to improve mobility Myofascial release: Fascial release around the mons pubis to reduce the swelling Fascial release to the round ligament Exercises: Stretches/mobility: Hip flexor stretch leaning on wall holding for 30 sec bil.  Butterfly stretch holding 1 min.  Trunk rotation pulling knee over the trunk holding 30 sec bil.  Prone press up 5 times Marjo Bicker pose with diaphragmatic breathing with emphasis on expanding the posterior rib cage Therapeutic activities: Functional strengthening activities: Educated patient on how to use the vaginal dilator, how to progress herself, how to insert the dilator and move around like a clock then use leg movement. Gave patient handout.   PATIENT EDUCATION: 08/13/23 Education details: Access Code: [email or web address], scar massage, how to use dilators Person educated: Patient Education method: Explanation, Demonstration, Tactile cues, Verbal cues, and Handouts Education comprehension: verbalized understanding, returned demonstration, verbal cues required, tactile cues required, and needs further education    HOME EXERCISE PROGRAM: 08/13/23 Access Code: [email or web address] URL: [email or web address] Date: 08/13/2023 Prepared by: Eulis Foster  Exercises - Gastroc Stretch on Wall  - 1 x daily - 7 x weekly - 1 sets - 2 reps - 30 sec hold - Supine Butterfly Groin Stretch  - 1 x daily - 7 x weekly - 1 sets - 1 reps - 1 min hold - Supine Piriformis Stretch with Leg Straight  - 1 x daily - 7 x weekly - 1 sets - 1 reps - 30 sec hold - Prone Press Up  - 1 x daily - 7 x weekly - 1 sets - 5 reps - Diaphragmatic Breathing in Child's Pose with Pelvic Floor Relaxation  - 1 x  daily - 7 x weekly - 1 sets - 1 reps - 30 sec hold - Sidelying Thoracic Rotation with Open Book  - 1 x daily - 7 x weekly - 3 sets - 10 reps - Hooklying Isometric Hip Flexion  - 1 x daily - 3 x weekly - 1 sets - 10 reps - Supine Bridge with Spinal Articulation  - 1 x daily - 3 x weekly - 1 sets - 10 reps - Quadruped Transversus Abdominis Bracing  - 1 x daily - 3 x weekly - 1 sets - 10 reps - Quadruped Leg Lifts  - 1 x daily - 3 x weekly - 1 sets - 10 reps  ASSESSMENT:  CLINICAL IMPRESSION: Patient is a 58 y.o. female who was seen today for physical therapy treatment for mixed incontinence. She understands how to use the vaginal dilators. She continues to have decreased mobility of the c-section scar. Patient is starting to learn how to engage her lower abdominals and obliques to assist with continence. She understands to use the dilators more often and how to progress them.  Patient will benefit from skilled therapy to reduce her urinary leakage, educated on menopausal changes that happen to the pelvic floor and lengthen tissue.   OBJECTIVE IMPAIRMENTS: decreased activity tolerance, decreased coordination, decreased strength, increased fascial restrictions, increased muscle spasms, and pain.   ACTIVITY LIMITATIONS: continence, toileting, and locomotion level  PARTICIPATION LIMITATIONS: interpersonal relationship, community activity, and occupation  PERSONAL FACTORS: Time since onset of injury/illness/exacerbation are also affecting patient's functional outcome.   REHAB POTENTIAL: Excellent  CLINICAL DECISION MAKING: Stable/uncomplicated  EVALUATION COMPLEXITY: Low   GOALS: Goals reviewed with patient?  Yes  SHORT TERM GOALS: Target date: 05/21/23  Patient educated on vaginal moisturizers to improve health of the tissue.  Baseline: Goal status: Met 04/30/23  2.  Patient educated on scar massage to improve mobility of tissue.  Baseline:  Goal status: met 04/30/23  3.  Patient is  able to contract her upper abdominals to bring her ribs down and reduce the rib angle.  Baseline:  Goal status: ongoing 08/13/23  4.  Patient educated on vaginal dilators and how to use to expand the vaginal canal.  Baseline:  Goal status: Met 08/13/23   LONG TERM GOALS: Target date: 10/22/23  Patient independent with advanced HEP for core and pelvic floor to reduce leakage.  Baseline:  Goal status: INITIAL  2.  Patient is on the largest dilator to expand the vaginal canal for penetration.  Baseline:  Goal status: INITIAL  3.  Patient reports her urinary leakage decreased >/= 75% due to increased pelvic floor strength >/= 3/5.  Baseline:  Goal status: INITIAL  4.  Patient is able to have penile penetration vaginally due to the relaxation of the introitus and lengthening of the tissue from the dilator.  Baseline:  Goal status: INITIAL  5.  Patient educated on changes to the pelvic floor and tissue with menopause.  Baseline:  Goal status: INITIAL  PLAN:  PT FREQUENCY: 1x/week  PT DURATION: 6 months  PLANNED INTERVENTIONS: 97110-Therapeutic exercises, 97530- Therapeutic activity, 97112- Neuromuscular re-education, 97140- Manual therapy, 97035- Ultrasound, Patient/Family education, Dry Needling, Joint mobilization, Spinal mobilization, Scar mobilization, Cryotherapy, Moist heat, and Biofeedback  PLAN FOR NEXT SESSION: scar work,  diaphragmatic breathing,  back stretches, manual work to the pelvic floor at the first level,    Eulis Foster, PT 08/13/23 1:21 PM

## 2023-08-20 ENCOUNTER — Ambulatory Visit: Payer: 59 | Admitting: Physical Therapy

## 2023-08-21 ENCOUNTER — Ambulatory Visit (HOSPITAL_BASED_OUTPATIENT_CLINIC_OR_DEPARTMENT_OTHER): Payer: 59 | Admitting: Cardiology

## 2023-08-21 VITALS — BP 138/68 | HR 70 | Ht 62.5 in | Wt 176.0 lb

## 2023-08-21 DIAGNOSIS — I2510 Atherosclerotic heart disease of native coronary artery without angina pectoris: Secondary | ICD-10-CM

## 2023-08-21 DIAGNOSIS — E119 Type 2 diabetes mellitus without complications: Secondary | ICD-10-CM

## 2023-08-21 DIAGNOSIS — Z8249 Family history of ischemic heart disease and other diseases of the circulatory system: Secondary | ICD-10-CM | POA: Diagnosis not present

## 2023-08-21 DIAGNOSIS — I10 Essential (primary) hypertension: Secondary | ICD-10-CM

## 2023-08-21 NOTE — Progress Notes (Signed)
 Cardiology Office Note:  .   Date:  08/21/2023  ID:  Gabrielle Russell, DOB Dec 03, 1965, MRN [id number] PCP: Gweneth Dimitri, MD  [facility] Providers Cardiologist:  None    History of Present Illness: .   Gabrielle Russell is a 58 y.o. female Discussed the use of AI scribe software for clinical note transcription with the patient, who gave verbal consent to proceed.  History of Present Illness [name] is a 58 year old female with type 2 diabetes who presents with an elevated coronary calcium score. She was referred by Dr. Selena Batten for evaluation of elevated coronary calcium score.  She presents with an elevated coronary calcium score of 311, placing her in the 97th percentile for her age group. This test was performed on May 28, 2023. She is concerned about her risk given her family history and her current age of 26, the same age her mother passed away from heart disease. No symptoms of chest pain, shortness of breath, or dizziness are reported under normal circumstances, although she experiences dizziness when sitting up too quickly after lying on her back during workouts.  She has a strong family history of coronary artery disease, with her mother, brother, and maternal uncle all having myocardial infarctions in their fifties. This familial pattern contributes to her concern about her own cardiovascular risk.  She has type 2 diabetes, with a recent hemoglobin A1c of 6.2, indicating good glycemic control. She manages her diabetes with medication and lifestyle modifications, including a healthy diet and regular exercise. Her current medications include Crestor 40 mg daily, aspirin 81 mg daily, telmisartan/hydrochlorothiazide 80/25 mg, and amlodipine for blood pressure control. Her LDL cholesterol is 61, and triglycerides are 120.  Her hypertension is generally well-controlled with her current medication regimen, although her blood pressure was elevated during today's visit, likely  due to nervousness. She continues to take telmisartan/hydrochlorothiazide and amlodipine as prescribed.  She is proactive about her health, engaging in regular exercise with a personal trainer and following a healthy diet. She is motivated to maintain her health and prevent any cardiac events.      ROS: No syncope, no bleeding  Studies Reviewed: Marland Kitchen   EKG Interpretation Date/Time:  Thursday August 21 2023 10:59:20 EDT Ventricular Rate:  62 PR Interval:  158 QRS Duration:  88 QT Interval:  448 QTC Calculation: 454 R Axis:   45  Text Interpretation: Normal sinus rhythm Normal ECG No previous ECGs available Confirmed by Donato Schultz ([id number]) on 08/21/2023 11:16:16 AM    Results LABS HbA1c: 6.8% LDL: 61 mg/dL Cr: 1.91 mg/dL Triglycerides: 478 mg/dL TSH: 2.0 Hb: 14  RADIOLOGY Coronary calcium score: 311 (05/28/2023)  Risk Assessment/Calculations:            Physical Exam:   VS:  BP 138/68   Pulse 70   Ht 5' 2.5" (1.588 m)   Wt 176 lb (79.8 kg)   LMP 06/09/2016 (Exact Date)   SpO2 98%   BMI 31.68 kg/m    Wt Readings from Last 3 Encounters:  08/21/23 176 lb (79.8 kg)  01/08/23 186 lb (84.4 kg)  07/23/21 185 lb (83.9 kg)    GEN: Well nourished, well developed in no acute distress NECK: No JVD; No carotid bruits CARDIAC: RRR, no murmurs, no rubs, no gallops RESPIRATORY:  Clear to auscultation without rales, wheezing or rhonchi  ABDOMEN: Soft, non-tender, non-distended EXTREMITIES:  No edema; No deformity   ASSESSMENT AND PLAN: .    Assessment and  Plan Assessment & Plan Elevated coronary calcium score   Coronary calcium score of 311 places her in the 97th percentile for her age. Strong family history of myocardial infarctions in relatives in their fifties. Mother MI age 54. Calcification present in coronary arteries and aorta, personally reviewed and interpreted. Asymptomatic with no chest pain, shortness of breath, or dizziness. Current management focuses on  aggressive cholesterol treatment. LDL goal < 70. Further testing with contrast-enhanced imaging or heart catheterization is reserved for symptom development.   - Continue rosuvastatin 40 mg daily.   - Continue aspirin 81 mg daily.   - Monitor for symptoms such as chest pain, shortness of breath, or unusual fatigue.   - Consider further testing with contrast-enhanced imaging or heart catheterization if symptoms develop.   - Encourage Mediterranean diet and regular exercise.    Type 2 diabetes mellitus   Type 2 diabetes is a significant risk factor for coronary artery disease. Hemoglobin A1c is 6.2, indicating good control. Continued management is crucial to minimize cardiovascular risk.   - Continue current diabetes management plan.   - Encourage adherence to diet and exercise regimen.    Hypertension   On telmisartan/hydrochlorothiazide and amlodipine for blood pressure management. Blood pressure is well-controlled, although slightly elevated during the visit, likely due to nervousness. Continued monitoring and management are necessary.   - Continue telmisartan/hydrochlorothiazide 80/25 mg daily.   - Continue amlodipine as prescribed.   - Monitor blood pressure regularly.      Feel free to reach out if needed.      Signed, Donato Schultz, MD

## 2023-08-21 NOTE — Patient Instructions (Signed)
 Medication Instructions:  ?Your physician recommends that you continue on your current medications as directed. Please refer to the Current Medication list given to you today.  ? ?Labwork: ?NONE ? ?Testing/Procedures: ?NONE ? ?Follow-Up: ?AS NEEDED  ? ?  ?

## 2023-08-27 ENCOUNTER — Ambulatory Visit: Payer: 59 | Admitting: Physical Therapy

## 2023-09-03 ENCOUNTER — Encounter: Payer: 59 | Admitting: Physical Therapy

## 2023-09-03 ENCOUNTER — Encounter: Payer: Self-pay | Admitting: Physical Therapy

## 2023-09-08 ENCOUNTER — Telehealth: Payer: Self-pay | Admitting: Physical Therapy

## 2023-09-08 ENCOUNTER — Ambulatory Visit: Payer: Self-pay | Admitting: Physical Therapy

## 2023-09-08 NOTE — Telephone Encounter (Signed)
 Called patient about her missed appointment today at 14:00. She was presently at the hospital with her dad. She tried to call to cancel but no answer. Then called again and left a message.  Marsha Skeen, PT @4 /28/25@ 2:23 PM

## 2023-09-10 ENCOUNTER — Ambulatory Visit: Admitting: Physical Therapy

## 2023-09-10 ENCOUNTER — Encounter: Payer: Self-pay | Admitting: Physical Therapy

## 2023-09-10 ENCOUNTER — Encounter: Payer: 59 | Admitting: Physical Therapy

## 2023-09-10 DIAGNOSIS — L905 Scar conditions and fibrosis of skin: Secondary | ICD-10-CM

## 2023-09-10 DIAGNOSIS — M6281 Muscle weakness (generalized): Secondary | ICD-10-CM

## 2023-09-10 DIAGNOSIS — R279 Unspecified lack of coordination: Secondary | ICD-10-CM

## 2023-09-10 NOTE — Therapy (Signed)
 OUTPATIENT PHYSICAL THERAPY FEMALE PELVIC TREATMENT   Patient Name: Gabrielle Russell MRN: [id number] DOB:1965-10-18, 58 y.o., female Today's Date: 09/10/2023  END OF SESSION:  PT End of Session - 09/10/23 1102     Visit Number 4    Date for PT Re-Evaluation 10/22/23    Authorization Type [employer]    PT Start Time 1100    PT Stop Time 1140    PT Time Calculation (min) 40 min    Activity Tolerance Patient tolerated treatment well    Behavior During Therapy WFL for tasks assessed/performed             Past Medical History:  Diagnosis Date   Amenorrhea    Anxiety    Depression    Diabetes mellitus (HCC)    Dyspareunia    Hypertension    Urinary incontinence    Past Surgical History:  Procedure Laterality Date   CARPAL TUNNEL RELEASE Right    CESAREAN SECTION     TUBAL LIGATION     Patient Active Problem List   Diagnosis Date Noted   Essential hypertension 10/05/2020   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   Hyperglycemia due to type 2 diabetes mellitus (HCC) 10/05/2020   Polycystic ovaries 10/05/2020   Recurrent major depression in remission (HCC) 10/05/2020   Sleep disturbance 10/05/2020   Vitamin D deficiency 10/05/2020   History of colonic polyps 02/11/2016   Type II or unspecified type diabetes mellitus without mention of complication, not stated as uncontrolled 12/15/2013    Class: History of   Accelerated hypertension 12/15/2013   Elevated cholesterol 12/15/2013    Class: History of    PCP: Helyn Lobstein, MD  REFERRING PROVIDER: Laine Piggs, NP   REFERRING DIAG:  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 Wear a pad during the day at work and not at home. Still on the second dilator. No pain now with the dilator.      Fluid intake: Yes: water and diet sundrop     PAIN:  Are you having pain? Yes NPRS scale: 10/10 Pain location: Vaginal  Pain type: stabbing Pain description: intermittent   Aggravating factors: with penile penetration  Relieving factors: no penile penetration  PRECAUTIONS: None  RED FLAGS: None   WEIGHT BEARING RESTRICTIONS: No  FALLS:  Has patient fallen in last 6 months? 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  PLOF: Independent  PATIENT GOALS: increased control of urinary leakage, reduced pain with intercourse, ways to manage symptoms of menopause  PERTINENT HISTORY:  Diabetes, Cesarean section  BOWEL MOVEMENT: Pain with bowel movement: Yes, rectal pain 7/10 Type of bowel movement:Type (Bristol Stool Scale) type 1 , type 4, Frequency 3 times per week, Strain Yes, and Splinting yes Fully empty rectum: Yes:   Leakage: No Fiber supplement: Yes: metamucil  URINATION: Pain with urination: No Fully empty bladder: Yes:   Stream: Strong Urgency: Yes: has had urgency when she was a kid Frequency: every 2-3 hours during the day, night 2-3 times  Leakage: Urge to void, Walking to the bathroom, Coughing, and Laughing Pads: Yes: 1-2 pad  INTERCOURSE: Pain with intercourse: Initial Penetration, During Penetration, and Pain Interrupts Intercourse Ability to have vaginal penetration:  No Marinoff Scale: 0/3  PREGNANCY: C-section deliveries 1  PROLAPSE: None   OBJECTIVE:  Note: Objective measures were completed at Evaluation unless otherwise noted.  DIAGNOSTIC  FINDINGS:  none   COGNITION: Overall cognitive status: Within functional limits for tasks assessed     SENSATION: Light touch: Appears intact Proprioception: Appears intact  LUMBAR SPECIAL TESTS:  Thomas test: positive bilaterally   POSTURE: decreased lumbar lordosis  PELVIC ALIGNMENT:  LUMBARAROM/PROM:lumbar ROM is limited by 25%   LOWER EXTREMITY ROM:  Active ROM Right eval Left eval  Hip flexion 4/5 4/5  Hip extension 4/5 4/5   (Blank rows = not tested)  LOWER EXTREMITY MMT:  MMT Right eval Left eval  Hip extension -5 -5   PALPATION:   General  rib cage greater than 90 degrees; tightness and skin overlaps the c-section scar                External Perineal Exam clitoral hood starting to attach to the clitorus; labia minora fused to labia majora; redness along the posterior vaginal canal                             Internal Pelvic Floor tenderness located along the levator ani and obturator internist  Patient confirms identification and approves PT to assess internal pelvic floor and treatment Yes  PELVIC MMT:   MMT eval 09/10/23  Vaginal 1/5 3/5  (Blank rows = not tested)        TONE: increased  PROLAPSE: none  TODAY'S TREATMENT:   09/10/23 Manual: Myofascial release: Urogenital release of the first layer of the pelvic floor going through the different layers Internal pelvic floor techniques: No emotional/communication barriers or cognitive limitation. Patient is motivated to learn. Patient understands and agrees with treatment goals and plan. PT explains patient will be examined in standing, sitting, and lying down to see how their muscles and joints work. When they are ready, they will be asked to remove their underwear so PT can examine their perineum. The patient is also given the option of providing their own chaperone as one is not provided in our facility. The patient also has the right and is explained the right to defer or refuse any part of  the evaluation  or treatment including the internal exam. With the patient's consent, PT will use one gloved finger to gently assess the muscles of the pelvic floor, seeing how well it contracts and relaxes and if there is muscle symmetry. After, the patient will get dressed and PT and patient will discuss exam findings and plan of care. PT and patient discuss plan of care, schedule, attendance policy and HEP activities. Going through the vaginal canal with one finger and other finger working the external tissue to lengthen the three different levels of the pelvic floor so it can fully relax and contract.  Neuromuscular re-education: Pelvic floor contraction training: Therapist finger in the vaginal canal working with diaphragmatic breathing to bulge the pelvic floor then work on contraction to work the full range of the muscles Self-care: Discussed with patient on using the next size dilator, educated patient on using the smallest dilator first then go to the next size.     08/13/23 Manual: Scar tissue mobilization: Lifting up of the lower abdominal scar to improve mobility Neuromuscular re-education: Bridge to work the gluteals and increase lumbar pelvic motion Core facilitation: Open book stretch with oblique contraction bringing hands together  Quadruped lift leg with core engaged Form correction: Lay on foam roll to stretch the lateral rib cage to elongate for diaphragmatic breathing Pelvic floor contraction training: Hip flexion isometric to engage abdomen with pelvic floor Quadruped rock with hips internally rotated to stretch the pelvic floor and relax Supine hip flexor stretch to elongate the anterior trunk Self-care: Reviewed with patient on using the dilators. She is to be using them 4-5 times per week. Using lubricant on the dilators. Not to have 3 out 10 pain when using them. Educated patient to use for 10 minutes. Educated patient on moving her hips around with the dilator and  move in and out. Educated patient on how to progress to the larger size dilator.     04/30/23 Manual: Scar tissue mobilization: Scar work to the c-section scar going through the restrictions to improve mobility Myofascial release: Fascial release around the mons pubis to reduce the swelling Fascial release to the round ligament Exercises: Stretches/mobility: Hip flexor stretch leaning on wall holding for 30 sec bil.  Butterfly stretch holding 1 min.  Trunk rotation pulling knee over the trunk holding 30 sec bil.  Prone press up 5 times Earline Glenn pose with diaphragmatic breathing with emphasis on expanding the posterior rib cage Therapeutic activities: Functional strengthening activities: Educated patient on how to use the vaginal dilator, how to progress herself, how to insert the dilator and move around like a clock then use leg movement. Gave patient handout.   PATIENT EDUCATION: 08/13/23 Education details: Access Code: [email or web address], scar massage, how to use dilators Person educated: Patient Education method: Explanation, Demonstration, Tactile cues, Verbal cues, and Handouts Education comprehension: verbalized understanding, returned demonstration, verbal cues required, tactile cues required, and needs further education    HOME EXERCISE PROGRAM: 08/13/23 Access Code: [email or web address] URL: [email or web address] Date: 08/13/2023 Prepared by: Marsha Skeen  Exercises - Gastroc Stretch on Wall  - 1 x daily - 7 x weekly - 1 sets - 2 reps - 30 sec hold - Supine Butterfly Groin Stretch  - 1 x daily - 7 x weekly - 1 sets - 1 reps - 1 min hold - Supine Piriformis Stretch with Leg Straight  - 1 x daily - 7 x weekly - 1 sets - 1 reps - 30 sec hold - Prone Press Up  - 1 x  daily - 7 x weekly - 1 sets - 5 reps - Diaphragmatic Breathing in Child's Pose with Pelvic Floor Relaxation  - 1 x daily - 7 x weekly - 1 sets - 1 reps - 30 sec hold - Sidelying Thoracic Rotation with Open Book  - 1 x  daily - 7 x weekly - 3 sets - 10 reps - Hooklying Isometric Hip Flexion  - 1 x daily - 3 x weekly - 1 sets - 10 reps - Supine Bridge with Spinal Articulation  - 1 x daily - 3 x weekly - 1 sets - 10 reps - Quadruped Transversus Abdominis Bracing  - 1 x daily - 3 x weekly - 1 sets - 10 reps - Quadruped Leg Lifts  - 1 x daily - 3 x weekly - 1 sets - 10 reps  ASSESSMENT:  CLINICAL IMPRESSION: Patient is a 58 y.o. female who was seen today for physical therapy treatment for mixed incontinence. Patient is not using pad when at home do to a reduction of urinary leakage. She is using the second dilator and understands how to progress herself.  Pelvic floor strength increased after the manual work. She was able to fully work through the excursion of the muscles. Pelvic floor strength increased from 1/5 to 3/5.  Patient will benefit from skilled therapy to reduce her urinary leakage, educated on menopausal changes that happen to the pelvic floor and lengthen tissue.   OBJECTIVE IMPAIRMENTS: decreased activity tolerance, decreased coordination, decreased strength, increased fascial restrictions, increased muscle spasms, and pain.   ACTIVITY LIMITATIONS: continence, toileting, and locomotion level  PARTICIPATION LIMITATIONS: interpersonal relationship, community activity, and occupation  PERSONAL FACTORS: Time since onset of injury/illness/exacerbation are also affecting patient's functional outcome.   REHAB POTENTIAL: Excellent  CLINICAL DECISION MAKING: Stable/uncomplicated  EVALUATION COMPLEXITY: Low   GOALS: Goals reviewed with patient? Yes  SHORT TERM GOALS: Target date: 05/21/23  Patient educated on vaginal moisturizers to improve health of the tissue.  Baseline: Goal status: Met 04/30/23  2.  Patient educated on scar massage to improve mobility of tissue.  Baseline:  Goal status: met 04/30/23  3.  Patient is able to contract her upper abdominals to bring her ribs down and reduce the  rib angle.  Baseline:  Goal status: ongoing 08/13/23  4.  Patient educated on vaginal dilators and how to use to expand the vaginal canal.  Baseline:  Goal status: Met 08/13/23   LONG TERM GOALS: Target date: 10/22/23  Patient independent with advanced HEP for core and pelvic floor to reduce leakage.  Baseline:  Goal status: INITIAL  2.  Patient is on the largest dilator to expand the vaginal canal for penetration.  Baseline:  Goal status: INITIAL  3.  Patient reports her urinary leakage decreased >/= 75% due to increased pelvic floor strength >/= 3/5.  Baseline:  Goal status: INITIAL  4.  Patient is able to have penile penetration vaginally due to the relaxation of the introitus and lengthening of the tissue from the dilator.  Baseline:  Goal status: INITIAL  5.  Patient educated on changes to the pelvic floor and tissue with menopause.  Baseline:  Goal status: INITIAL  PLAN:  PT FREQUENCY: 1x/week  PT DURATION: 6 months  PLANNED INTERVENTIONS: 97110-Therapeutic exercises, 97530- Therapeutic activity, 97112- Neuromuscular re-education, [id number]- Manual therapy, 97035- Ultrasound, Patient/Family education, Dry Needling, Joint mobilization, Spinal mobilization, Scar mobilization, Cryotherapy, Moist heat, and Biofeedback  PLAN FOR NEXT SESSION: scar work,  diaphragmatic breathing,  back stretches;  pelvic floor strength  Marsha Skeen, PT 09/10/23 11:43 AM

## 2023-09-15 ENCOUNTER — Encounter: Payer: Self-pay | Admitting: Physical Therapy

## 2023-09-17 ENCOUNTER — Ambulatory Visit: Admitting: Physical Therapy

## 2023-09-22 ENCOUNTER — Encounter: Payer: Self-pay | Admitting: Physical Therapy

## 2023-09-22 ENCOUNTER — Ambulatory Visit: Attending: Radiology | Admitting: Physical Therapy

## 2023-09-22 DIAGNOSIS — L905 Scar conditions and fibrosis of skin: Secondary | ICD-10-CM | POA: Diagnosis present

## 2023-09-22 DIAGNOSIS — M6281 Muscle weakness (generalized): Secondary | ICD-10-CM | POA: Insufficient documentation

## 2023-09-22 DIAGNOSIS — R279 Unspecified lack of coordination: Secondary | ICD-10-CM | POA: Insufficient documentation

## 2023-09-22 NOTE — Therapy (Addendum)
 OUTPATIENT PHYSICAL THERAPY FEMALE PELVIC TREATMENT   Patient Name: Gabrielle Russell MRN: [id number] DOB:17-Jan-1966, 58 y.o., female Today's Date: 09/22/2023  END OF SESSION:  PT End of Session - 09/22/23 1229     Visit Number 5    Date for PT Re-Evaluation 10/22/23    Authorization Type [employer]    PT Start Time 1230    PT Stop Time 1310    PT Time Calculation (min) 40 min    Activity Tolerance Patient tolerated treatment well    Behavior During Therapy WFL for tasks assessed/performed             Past Medical History:  Diagnosis Date   Amenorrhea    Anxiety    Depression    Diabetes mellitus (HCC)    Dyspareunia    Hypertension    Urinary incontinence    Past Surgical History:  Procedure Laterality Date   CARPAL TUNNEL RELEASE Right    CESAREAN SECTION     TUBAL LIGATION     Patient Active Problem List   Diagnosis Date Noted   Essential hypertension 10/05/2020   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   Hyperglycemia due to type 2 diabetes mellitus (HCC) 10/05/2020   Polycystic ovaries 10/05/2020   Recurrent major depression in remission (HCC) 10/05/2020   Sleep disturbance 10/05/2020   Vitamin D deficiency 10/05/2020   History of colonic polyps 02/11/2016   Type II or unspecified type diabetes mellitus without mention of complication, not stated as uncontrolled 12/15/2013    Class: History of   Accelerated hypertension 12/15/2013   Elevated cholesterol 12/15/2013    Class: History of    PCP: Helyn Lobstein, MD  REFERRING PROVIDER: Laine Piggs, NP   REFERRING DIAG:  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 Things have been busy so difficult to focus on the dilator. I am doing the stretches and scar massage.  Fluid intake: Yes: water and diet sundrop    PAIN:  Are you having pain? Yes NPRS scale: 10/10 Pain location: Vaginal  Pain type: stabbing Pain description: intermittent   Aggravating  factors: with penile penetration  Relieving factors: no penile penetration  PRECAUTIONS: None  RED FLAGS: None   WEIGHT BEARING RESTRICTIONS: No  FALLS:  Has patient fallen in last 6 months? 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  PLOF: Independent  PATIENT GOALS: increased control of urinary leakage, reduced pain with intercourse, ways to manage symptoms of menopause  PERTINENT HISTORY:  Diabetes, Cesarean section  BOWEL MOVEMENT: Pain with bowel movement: Yes, rectal pain 7/10 09/22/23: pain with bowel movements is 5/10 and happens less often, 3 times per month Type of bowel movement:Type (Bristol Stool Scale) type 1 , type 4, Frequency 3 times per week, Strain Yes, and Splinting yes 09/22/23: straining 25% less with bowel movements Fully empty rectum: Yes:   Leakage: No Fiber supplement: Yes: metamucil  URINATION: Pain with urination: No Fully empty bladder: Yes:   Stream: Strong Urgency: Yes: has had urgency when she was a kid Frequency: every 2-3 hours during the day, night 2-3 times  09/22/23: wakes up 2-3 times per night Leakage: Urge to void, Walking to the bathroom, Coughing, and Laughing Pads: Yes: 1-2 pad  INTERCOURSE: Pain with intercourse: Initial Penetration, During Penetration, and Pain Interrupts Intercourse Ability to have vaginal penetration:  No Marinoff Scale:  0/3  PREGNANCY: C-section deliveries 1  PROLAPSE: None   OBJECTIVE:  Note: Objective measures were completed at Evaluation unless otherwise noted.  DIAGNOSTIC FINDINGS:  none   COGNITION: Overall cognitive status: Within functional limits for tasks assessed     SENSATION: Light touch: Appears intact Proprioception: Appears intact  LUMBAR SPECIAL TESTS:  Thomas test: positive bilaterally   POSTURE: decreased lumbar lordosis  PELVIC ALIGNMENT:  LUMBARAROM/PROM:lumbar ROM is limited by 25%   LOWER EXTREMITY ROM:  Active ROM Right eval Left eval  Hip flexion 4/5 4/5  Hip extension 4/5 4/5   (Blank rows = not tested)  LOWER EXTREMITY MMT:  A/AROM Right eval Left eval Right/Left 09/22/23  Hip extension -5 -5 0   PALPATION:   General  rib cage greater than 90 degrees; tightness and skin overlaps the c-section scar                External Perineal Exam clitoral hood starting to attach to the clitorus; labia minora fused to labia majora; redness along the posterior vaginal canal                             Internal Pelvic Floor tenderness located along the levator ani and obturator internist  Patient confirms identification and approves PT to assess internal pelvic floor and treatment Yes  PELVIC MMT:   MMT eval 09/10/23  Vaginal 1/5 3/5  (Blank rows = not tested)        TONE: increased  PROLAPSE: none  TODAY'S TREATMENT:   09/22/23 Neuromuscular re-education: Core retraining: Hip flexion isometric with towel roll under back for tactile cues 20 x Form correction: Anterior hip mobilization while moving hip into extension in prone and stretch the quadriceps Prone hip extension with focus on gluteal contraction 2 x 10 Bridge to work on hip extension and stretch the anterior hip One leg on upper step and lean forward to stretch the anterior hip holding 30 sec 2 times each Down training: Educated patient on how to delay the urge to void to reduce the  number of times she wakes up at night.  Also discussed with patient to not drink her soda during dinner due to it can be a bladder irritant and try for 3 nights.  Exercises: Stretches/mobility: Double knee to chest holding 30 sec  Single knee to chest holding 30 sec bil.  Earline Glenn pose but hurts her knees and she has difficulty with getting her knees on her feet due to tightness    09/10/23 Manual: Myofascial release: Urogenital release of the first layer of the pelvic floor going through the different layers Internal pelvic floor techniques: No emotional/communication barriers or cognitive limitation. Patient is motivated to learn. Patient understands and agrees with treatment goals and plan. PT explains patient will be examined in standing, sitting, and lying down to see how their muscles and joints work. When they are ready, they will be asked to remove their underwear so PT can examine their perineum. The patient is also given the option of providing their own chaperone as one is not provided in our facility. The patient also has the right and is explained the right to defer or refuse any part of the evaluation or treatment including the internal exam. With the patient's consent, PT will use one gloved finger to gently assess the muscles of the pelvic floor, seeing how well it contracts and relaxes and if there is muscle symmetry. After, the patient will get dressed and PT and patient will discuss exam findings and plan of care. PT and patient discuss plan of care, schedule, attendance policy and HEP activities. Going through the vaginal canal with one finger and other finger working the external tissue to lengthen the three different levels of the pelvic floor so it can fully relax and contract.  Neuromuscular re-education: Pelvic floor contraction training: Therapist finger in the vaginal canal working with diaphragmatic breathing to bulge the pelvic floor then work on contraction to work the full  range of the muscles Self-care: Discussed with patient on using the next size dilator, educated patient on using the smallest dilator first then go to the next size.     08/13/23 Manual: Scar tissue mobilization: Lifting up of the lower abdominal scar to improve mobility Neuromuscular re-education: Bridge to work the gluteals and increase lumbar pelvic motion Core facilitation: Open book stretch with oblique contraction bringing hands together  Quadruped lift leg with core engaged Form correction: Lay on foam roll to stretch the lateral rib cage to elongate for diaphragmatic breathing Pelvic floor contraction training: Hip flexion isometric to engage abdomen with pelvic floor Quadruped rock with hips internally rotated to stretch the pelvic floor and relax Supine hip flexor stretch to elongate the anterior trunk Self-care: Reviewed with patient on using the dilators. She is to be using them 4-5 times per week. Using lubricant on the dilators. Not to have 3 out 10 pain when using them. Educated patient to use for 10 minutes. Educated patient on moving her hips around with the dilator and move in and out. Educated patient on how to progress to the larger size dilator.     04/30/23 Manual: Scar tissue mobilization: Scar work to the c-section scar going through the restrictions to improve mobility Myofascial release: Fascial release around the mons pubis to reduce the swelling Fascial release to the round ligament Exercises: Stretches/mobility: Hip flexor stretch leaning on wall holding for 30 sec bil.  Butterfly stretch holding 1 min.  Trunk rotation pulling knee over the trunk holding 30 sec bil.  Prone press up 5 times Earline Glenn pose with diaphragmatic breathing with emphasis on  expanding the posterior rib cage Therapeutic activities: Functional strengthening activities: Educated patient on how to use the vaginal dilator, how to progress herself, how to insert the dilator and move  around like a clock then use leg movement. Gave patient handout.   PATIENT EDUCATION: 09/22/23 Education details: Access Code: [email or web address], scar massage, how to use dilators, urge to void Person educated: Patient Education method: Explanation, Demonstration, Tactile cues, Verbal cues, and Handouts Education comprehension: verbalized understanding, returned demonstration, verbal cues required, tactile cues required, and needs further education    HOME EXERCISE PROGRAM: 09/22/23 Access Code: [email or web address] URL: [email or web address] Date: 09/22/2023 Prepared by: Marsha Skeen  Exercises - Gastroc Stretch on Wall  - 1 x daily - 7 x weekly - 1 sets - 2 reps - 30 sec hold - Supine Butterfly Groin Stretch  - 1 x daily - 7 x weekly - 1 sets - 1 reps - 1 min hold - Supine Piriformis Stretch with Leg Straight  - 1 x daily - 7 x weekly - 1 sets - 1 reps - 30 sec hold - Prone Press Up  - 1 x daily - 7 x weekly - 1 sets - 5 reps - Sidelying Thoracic Rotation with Open Book  - 1 x daily - 7 x weekly - 3 sets - 10 reps - Hooklying Isometric Hip Flexion  - 1 x daily - 3 x weekly - 1 sets - 10 reps - Supine Bridge with Spinal Articulation  - 1 x daily - 3 x weekly - 1 sets - 10 reps - Quadruped Leg Lifts  - 1 x daily - 3 x weekly - 1 sets - 10 reps - Supine Double Knee to Chest  - 1 x daily - 7 x weekly - 1 sets - 1 reps - 30 sec hold   ASSESSMENT:  CLINICAL IMPRESSION: Patient is a 57 y.o. female who was seen today for physical therapy treatment for mixed incontinence. Rectal pain is 5/10 instead of 7/10 and not as often. Straining 25% less with bowel movements.  Patient has improved hip extension. She understands the urge to void behavioral techniques to reduce the amount of times she urinated during her sleep. Patient will benefit from skilled therapy to reduce her urinary leakage, educated on menopausal changes that happen to the pelvic floor and lengthen tissue.   OBJECTIVE IMPAIRMENTS:  decreased activity tolerance, decreased coordination, decreased strength, increased fascial restrictions, increased muscle spasms, and pain.   ACTIVITY LIMITATIONS: continence, toileting, and locomotion level  PARTICIPATION LIMITATIONS: interpersonal relationship, community activity, and occupation  PERSONAL FACTORS: Time since onset of injury/illness/exacerbation are also affecting patient's functional outcome.   REHAB POTENTIAL: Excellent  CLINICAL DECISION MAKING: Stable/uncomplicated  EVALUATION COMPLEXITY: Low   GOALS: Goals reviewed with patient? Yes  SHORT TERM GOALS: Target date: 05/21/23  Patient educated on vaginal moisturizers to improve health of the tissue.  Baseline: Goal status: Met 04/30/23  2.  Patient educated on scar massage to improve mobility of tissue.  Baseline:  Goal status: met 04/30/23  3.  Patient is able to contract her upper abdominals to bring her ribs down and reduce the rib angle.  Baseline:  Goal status: ongoing 08/13/23  4.  Patient educated on vaginal dilators and how to use to expand the vaginal canal.  Baseline:  Goal status: Met 08/13/23   LONG TERM GOALS: Target date: 10/22/23  Patient independent with advanced HEP for core and pelvic floor to reduce leakage.  Baseline:  Goal status: INITIAL  2.  Patient is on the largest dilator to expand the vaginal canal for penetration.  Baseline:  Goal status: INITIAL  3.  Patient reports her urinary leakage decreased >/= 75% due to increased pelvic floor strength >/= 3/5.  Baseline:  Goal status: INITIAL  4.  Patient is able to have penile penetration vaginally due to the relaxation of the introitus and lengthening of the tissue from the dilator.  Baseline:  Goal status: INITIAL  5.  Patient educated on changes to the pelvic floor and tissue with menopause.  Baseline:  Goal status: INITIAL  PLAN:  PT FREQUENCY: 1x/week  PT DURATION: 6 months  PLANNED INTERVENTIONS:  97110-Therapeutic exercises, 97530- Therapeutic activity, 97112- Neuromuscular re-education, 97140- Manual therapy, 97035- Ultrasound, Patient/Family education, Dry Needling, Joint mobilization, Spinal mobilization, Scar mobilization, Cryotherapy, Moist heat, and Biofeedback  PLAN FOR NEXT SESSION: scar work,  diaphragmatic breathing, pelvic floor strength  Marsha Skeen, PT 09/22/23 1:19 PM   PHYSICAL THERAPY DISCHARGE SUMMARY  Visits from Start of Care: 5  Current functional level related to goals / functional outcomes: See above. Patient spoke to our secretary and informed her she wanted to be discharged at this time.    Remaining deficits: See above.    Education / Equipment: HEP   Patient agrees to discharge. Patient goals were not met. Patient is being discharged due to the patient's request. Thank you for the referral.   Marsha Skeen, PT 10/23/23 11:47 AM

## 2023-09-22 NOTE — Patient Instructions (Signed)
 Urge Incontinence  Ideal urination frequency is every 2-4 wakeful hours, which equates to 5-8 times within a 24-hour period.   Urge incontinence is leakage that occurs when the bladder muscle contracts, creating a sudden need to go before getting to the bathroom.   Going too often when your bladder isn't actually full can disrupt the body's automatic signals to store and hold urine longer, which will increase urgency/frequency.  In this case, the bladder "is running the show" and strategies can be learned to retrain this pattern.   One should be able to control the first urge to urinate, at around .  The bladder can hold up to a "grande latte," or . To help you gain control, practice the Urge Drill below when urgency strikes.  This drill will help retrain your bladder signals and allow you to store and hold urine longer.  The overall goal is to stretch out your time between voids to reach a more manageable voiding schedule.    Practice your "quick flicks" often throughout the day (each waking hour) even when you don't need feel the urge to go.  This will help strengthen your pelvic floor muscles, making them more effective in controlling leakage.  Urge Drill  When you feel an urge to go, follow these steps to regain control: Stop what you are doing and be still Take one deep breath, directing your air into your abdomen Think an affirming thought, such as "I've got this." Do 5 quick flicks of your pelvic floor Pound heels on the bed  delay voiding Think of something that will take your mind off the urination   [facility] [address], [address] [place], [place] [address] Phone # [phone] Fax [phone]

## 2023-09-24 ENCOUNTER — Ambulatory Visit: Payer: Self-pay | Admitting: Physical Therapy

## 2023-09-29 ENCOUNTER — Encounter: Admitting: Physical Therapy

## 2023-10-10 ENCOUNTER — Ambulatory Visit: Admitting: Physical Therapy

## 2023-10-15 ENCOUNTER — Ambulatory Visit: Admitting: Physical Therapy

## 2023-10-22 ENCOUNTER — Other Ambulatory Visit: Payer: Self-pay | Admitting: Family Medicine

## 2023-10-22 ENCOUNTER — Ambulatory Visit: Attending: Radiology | Admitting: Physical Therapy

## 2023-10-22 DIAGNOSIS — Z1231 Encounter for screening mammogram for malignant neoplasm of breast: Secondary | ICD-10-CM

## 2023-12-08 ENCOUNTER — Ambulatory Visit

## 2024-01-01 ENCOUNTER — Ambulatory Visit

## 2024-01-06 ENCOUNTER — Ambulatory Visit
Admission: RE | Admit: 2024-01-06 | Discharge: 2024-01-06 | Disposition: A | Discharge status: Home / Self Care | Source: Ambulatory Visit | Attending: Family Medicine | Admitting: Family Medicine

## 2024-01-06 DIAGNOSIS — Z1231 Encounter for screening mammogram for malignant neoplasm of breast: Secondary | ICD-10-CM

## 2024-01-13 ENCOUNTER — Ambulatory Visit (INDEPENDENT_AMBULATORY_CARE_PROVIDER_SITE_OTHER): Admitting: Radiology

## 2024-01-13 ENCOUNTER — Encounter: Payer: Self-pay | Admitting: Radiology

## 2024-01-13 ENCOUNTER — Other Ambulatory Visit (HOSPITAL_COMMUNITY)
Admission: RE | Admit: 2024-01-13 | Discharge: 2024-01-13 | Disposition: A | Discharge status: Home / Self Care | Source: Ambulatory Visit | Attending: Radiology | Admitting: Radiology

## 2024-01-13 ENCOUNTER — Other Ambulatory Visit: Payer: Self-pay | Admitting: Radiology

## 2024-01-13 VITALS — BP 122/72 | HR 68 | Ht 63.5 in | Wt 178.0 lb

## 2024-01-13 DIAGNOSIS — N958 Other specified menopausal and perimenopausal disorders: Secondary | ICD-10-CM

## 2024-01-13 DIAGNOSIS — N760 Acute vaginitis: Secondary | ICD-10-CM | POA: Diagnosis not present

## 2024-01-13 DIAGNOSIS — Z13820 Encounter for screening for osteoporosis: Secondary | ICD-10-CM

## 2024-01-13 DIAGNOSIS — Z01419 Encounter for gynecological examination (general) (routine) without abnormal findings: Secondary | ICD-10-CM | POA: Insufficient documentation

## 2024-01-13 DIAGNOSIS — Z1331 Encounter for screening for depression: Secondary | ICD-10-CM | POA: Diagnosis not present

## 2024-01-13 DIAGNOSIS — N3946 Mixed incontinence: Secondary | ICD-10-CM

## 2024-01-13 LAB — WET PREP FOR TRICH, YEAST, CLUE

## 2024-01-13 MED ORDER — NYSTATIN-TRIAMCINOLONE 100000-0.1 UNIT/GM-% EX OINT: 1.0000 | TOPICAL_OINTMENT | Freq: Two times a day (BID) | CUTANEOUS | 0 refills | Status: AC

## 2024-01-13 MED ORDER — ESTRADIOL 10 MCG VA TABS: 1.0000 | ORAL_TABLET | VAGINAL | 4 refills | Status: AC

## 2024-01-13 NOTE — Addendum Note (Signed)
 Addended by: Isabeau Mccalla on: 01/13/2024 11:11 AM   Modules accepted: Orders

## 2024-01-13 NOTE — Progress Notes (Signed)
   Gabrielle Russell [birth year] [id number]   History: Postmenopausal 58 y.o. presents for annual exam. Doing PFPT for incontinence, working well. C/o vulvar burning and itching. Has tried monistat, vagisil with no relief.    Gynecologic History Postmenopausal Last Pap: 2022. Results were: normal Last mammogram: 7/24. Results were: normal Last colonoscopy: 2022 DEXA:never  Obstetric History OB History  Gravida Para Term Preterm AB Living  2 1 1  1 1   SAB IAB Ectopic Multiple Live Births   1   1    # Outcome Date GA Lbr Len/2nd Weight Sex Type Anes PTL Lv  2 IAB           1 Term     M CS-Classical   LIV    Obstetric Comments  1 biological child, 2 step children, 1 adopted child       01/13/2024   10:22 AM  Depression screen PHQ 2/9  Decreased Interest 0  Down, Depressed, Hopeless 0  PHQ - 2 Score 0     The following portions of the patient's history were reviewed and updated as appropriate: allergies, current medications, past family history, past medical history, past social history, past surgical history, and problem list.  Review of Systems Pertinent items noted in HPI and remainder of comprehensive ROS otherwise negative.  Past medical history, past surgical history, family history and social history were all reviewed and documented in the EPIC chart.  Exam:  Vitals:   01/13/24 1021  BP: 122/72  Pulse: 68  SpO2: 95%  Weight: 178 lb (80.7 kg)  Height: 5' 3.5 (1.613 m)   Body mass index is 31.04 kg/m.  General appearance:  Normal Thyroid :  Symmetrical, normal in size, without palpable masses or nodularity. Respiratory  Auscultation:  Clear without wheezing or rhonchi Cardiovascular  Auscultation:  Regular rate, without rubs, murmurs or gallops  Edema/varicosities:  Not grossly evident Abdominal  Soft,nontender, without masses, guarding or rebound.  Liver/spleen:  No organomegaly noted  Hernia:  None appreciated  Skin  Inspection:  Grossly  normal Breasts: Examined lying and sitting.   Right: Without masses, retractions, nipple discharge or axillary adenopathy.   Left: Without masses, retractions, nipple discharge or axillary adenopathy. Genitourinary   Inguinal/mons:  Normal without inguinal adenopathy  External genitalia:  erythema and excoriation  BUS/Urethra/Skene's glands:  Normal  Vagina:  Normal appearing with normal color and discharge, no lesions. Atrophy: moderate   Cervix:  Normal appearing without discharge or lesions  Uterus:  Normal in size, shape and contour.  Midline and mobile, nontender  Adnexa/parametria:     Rt: Normal in size, without masses or tenderness.   Lt: Normal in size, without masses or tenderness.  Anus and perineum: Normal    [name], CMA present for exam   Microscopic wet-mount exam shows negative for pathogens, normal epithelial cells .  Assessment/Plan:   1. Well woman exam with routine gynecological exam (Primary)  2. Vulvovaginitis - WET PREP FOR TRICH, YEAST, CLUE - nystatin -triamcinolone  ointment (MYCOLOG); Apply 1 Application topically 2 (two) times daily.  Dispense: 30 g; Refill: 0  3. Depression screen  4. Genitourinary syndrome of menopause - Estradiol  (VAGIFEM ) 10 MCG TABS vaginal tablet; Place 1 tablet (10 mcg total) vaginally 2 (two) times a week.  Dispense: 24 tablet; Refill: 4   5. Screening for osteoporosis - DG Bone Density; Future   Return in 1 year for annual or sooner prn.  Elan Brainerd B WHNP-BC, 10:41 AM 01/13/2024

## 2024-01-15 ENCOUNTER — Ambulatory Visit: Payer: Self-pay | Admitting: Radiology

## 2024-01-15 LAB — CYTOLOGY - PAP
Comment: NEGATIVE
Diagnosis: NEGATIVE
High risk HPV: NEGATIVE

## 2024-01-15 MED ORDER — FLUCONAZOLE 150 MG PO TABS: 150.0000 mg | ORAL_TABLET | Freq: Once | ORAL | 0 refills | Status: AC

## 2024-01-16 ENCOUNTER — Other Ambulatory Visit (HOSPITAL_COMMUNITY)

## 2024-01-23 ENCOUNTER — Ambulatory Visit (HOSPITAL_COMMUNITY)
Admission: RE | Admit: 2024-01-23 | Discharge: 2024-01-23 | Disposition: A | Discharge status: Home / Self Care | Source: Ambulatory Visit | Attending: Radiology | Admitting: Radiology

## 2024-01-23 DIAGNOSIS — Z13820 Encounter for screening for osteoporosis: Secondary | ICD-10-CM | POA: Insufficient documentation

## 2024-02-03 NOTE — Telephone Encounter (Signed)
 Osteopenia, not osteoporosis, this may be reversed with vit D, calcium and strength training. No increased risk for fracture currently.

## 2024-03-15 ENCOUNTER — Ambulatory Visit: Admitting: Physical Therapy

## 2024-04-27 ENCOUNTER — Telehealth: Payer: Self-pay | Admitting: *Deleted

## 2024-04-27 NOTE — Telephone Encounter (Signed)
 Spoke with patient. Tx for yeast on 01/13/24, diflucan  PO x1. Symptoms resolved. Hx of DM2. Symptoms returned 1 week ago. Requesting Rx.   Reports vaginal itching, burning, irritation.  Denies vaginal d/c, odor, bleeding, urinary symptoms.   Has tried OTC vagisil, vagicaine,suppository and desitin, no change in symptoms.   Advised OV recommended for further evaluation. Patient asking if Rx can be sent. Advised I will send to [place] to review when she returns on 12/17 and our office will f/u. Patient agreeable.   Last AEX 01/13/24  Routing to [place] to review.

## 2024-04-28 MED ORDER — FLUCONAZOLE 150 MG PO TABS: 150.0000 mg | ORAL_TABLET | ORAL | 0 refills | Status: DC

## 2024-04-28 NOTE — Telephone Encounter (Signed)
 Spoke with patient, advised per Jami. Rx sent. Patient verbalizes understanding and is agreeable.   Encounter closed.

## 2024-04-28 NOTE — Telephone Encounter (Signed)
 Please send Diflcuan 150 x 2 if no improvement after 1 week will need OV.

## 2024-05-25 ENCOUNTER — Ambulatory Visit: Admitting: Radiology

## 2024-05-26 ENCOUNTER — Ambulatory Visit: Admitting: Radiology

## 2024-05-26 VITALS — BP 114/70 | Wt 177.0 lb

## 2024-05-26 DIAGNOSIS — N952 Postmenopausal atrophic vaginitis: Secondary | ICD-10-CM

## 2024-05-26 LAB — WET PREP FOR TRICH, YEAST, CLUE

## 2024-05-26 MED ORDER — ESTRADIOL 0.01 % VA CREA: TOPICAL_CREAM | VAGINAL | 6 refills | Status: AC

## 2024-05-26 MED ORDER — FLUCONAZOLE 150 MG PO TABS: 150.0000 mg | ORAL_TABLET | ORAL | 0 refills | Status: AC

## 2024-05-26 NOTE — Progress Notes (Signed)
" ° ° ° ° °  Subjective: Gabrielle Russell is a 59 y.o. female who complains of vaginal itching, burning, no discharge, no odor. Symptoms began 1 month ago (tried # 2 diflucan  with minimal relief). Has not been using Vagifem  regularly.   Review of Systems  All other systems reviewed and are negative.   Past Medical History:  Diagnosis Date   Amenorrhea    Anxiety    Depression    Diabetes mellitus (HCC)    Dyspareunia    Hypertension    Urinary incontinence       Objective:  Today's Vitals   05/26/24 1406  BP: 114/70  Weight: 177 lb (80.3 kg)   Body mass index is 30.86 kg/m.   Physical Exam Vitals and nursing note reviewed. Exam conducted with a chaperone present.  Constitutional:      Appearance: Normal appearance. She is well-developed.  Pulmonary:     Effort: Pulmonary effort is normal.  Abdominal:     General: Abdomen is flat.     Palpations: Abdomen is soft.  Genitourinary:    General: Normal vulva.     Vagina: Erythema (moderately atrophic) present. No vaginal discharge, bleeding or lesions.     Cervix: Normal. No discharge, friability, lesion or erythema.     Uterus: Normal.   Neurological:     Mental Status: She is alert.  Psychiatric:        Mood and Affect: Mood normal.        Thought Content: Thought content normal.        Judgment: Judgment normal.     Microscopic wet-mount exam shows hyphae.   [name], CMA present for exam  Assessment:/Plan:   1. Atrophic vaginitis (Primary)  - WET PREP FOR TRICH, YEAST, CLUE - Continue using vagifem  twice weekly, try to be consistent along with adding estradiol  (ESTRACE ) 0.01 % CREA vaginal cream; Apply 0.5grams to the vulva 3 times a week  Dispense: 42.5 g; Refill: 6    Avoid intercourse until symptoms are resolved. Safe sex encouraged. Avoid the use of soaps or perfumed products in the peri area. Avoid tub baths and sitting in sweaty or wet clothing for prolonged periods of time.     [name]  B, NP 2:14 PM  "

## 2025-01-14 ENCOUNTER — Ambulatory Visit: Admitting: Radiology
